# Patient Record
Sex: Male | Born: 1970 | Race: White | Hispanic: No | Marital: Married | Smoking: Never smoker
Health system: Southern US, Community
[De-identification: ages and names within clinical notes are randomized; demographics above are authoritative.]

## PROBLEM LIST (undated history)

## (undated) DIAGNOSIS — K5732 Diverticulitis of large intestine without perforation or abscess without bleeding: Secondary | ICD-10-CM

## (undated) DIAGNOSIS — R519 Headache, unspecified: Secondary | ICD-10-CM

## (undated) DIAGNOSIS — K529 Noninfective gastroenteritis and colitis, unspecified: Secondary | ICD-10-CM

## (undated) DIAGNOSIS — M109 Gout, unspecified: Secondary | ICD-10-CM

## (undated) DIAGNOSIS — I10 Essential (primary) hypertension: Secondary | ICD-10-CM

## (undated) DIAGNOSIS — F419 Anxiety disorder, unspecified: Secondary | ICD-10-CM

## (undated) DIAGNOSIS — R51 Headache: Secondary | ICD-10-CM

## (undated) HISTORY — DX: Headache, unspecified: R51.9

## (undated) HISTORY — DX: Diverticulitis of large intestine without perforation or abscess without bleeding: K57.32

## (undated) HISTORY — DX: Headache: R51

## (undated) HISTORY — DX: Anxiety disorder, unspecified: F41.9

## (undated) HISTORY — PX: EYE SURGERY: SHX253

## (undated) HISTORY — DX: Noninfective gastroenteritis and colitis, unspecified: K52.9

## (undated) HISTORY — DX: Gout, unspecified: M10.9

---

## 1999-04-08 ENCOUNTER — Encounter: Payer: Self-pay | Admitting: Nephrology

## 1999-04-08 ENCOUNTER — Ambulatory Visit (HOSPITAL_COMMUNITY): Admission: RE | Admit: 1999-04-08 | Discharge: 1999-04-08 | Payer: Self-pay | Admitting: Nephrology

## 2001-08-29 ENCOUNTER — Encounter: Payer: Self-pay | Admitting: Emergency Medicine

## 2001-08-29 ENCOUNTER — Emergency Department (HOSPITAL_COMMUNITY): Admission: EM | Admit: 2001-08-29 | Discharge: 2001-08-29 | Payer: Self-pay | Admitting: Emergency Medicine

## 2002-04-29 ENCOUNTER — Emergency Department (HOSPITAL_COMMUNITY): Admission: EM | Admit: 2002-04-29 | Discharge: 2002-04-29 | Payer: Self-pay | Admitting: Emergency Medicine

## 2002-04-29 ENCOUNTER — Encounter: Payer: Self-pay | Admitting: Emergency Medicine

## 2003-09-06 ENCOUNTER — Encounter: Admission: RE | Admit: 2003-09-06 | Discharge: 2003-09-06 | Payer: Self-pay | Admitting: Family Medicine

## 2003-09-18 ENCOUNTER — Ambulatory Visit (HOSPITAL_COMMUNITY): Admission: RE | Admit: 2003-09-18 | Discharge: 2003-09-18 | Payer: Self-pay | Admitting: Family Medicine

## 2003-09-18 ENCOUNTER — Encounter: Admission: RE | Admit: 2003-09-18 | Discharge: 2003-09-18 | Payer: Self-pay | Admitting: Family Medicine

## 2003-09-19 ENCOUNTER — Encounter: Admission: RE | Admit: 2003-09-19 | Discharge: 2003-09-19 | Payer: Self-pay | Admitting: Family Medicine

## 2005-03-18 ENCOUNTER — Emergency Department (HOSPITAL_COMMUNITY): Admission: EM | Admit: 2005-03-18 | Discharge: 2005-03-19 | Payer: Self-pay | Admitting: Emergency Medicine

## 2006-12-01 LAB — HM COLONOSCOPY

## 2007-06-02 ENCOUNTER — Emergency Department (HOSPITAL_COMMUNITY): Admission: EM | Admit: 2007-06-02 | Discharge: 2007-06-02 | Payer: Self-pay | Admitting: Emergency Medicine

## 2007-11-24 ENCOUNTER — Emergency Department (HOSPITAL_COMMUNITY): Admission: EM | Admit: 2007-11-24 | Discharge: 2007-11-24 | Payer: Self-pay | Admitting: Emergency Medicine

## 2007-12-14 ENCOUNTER — Ambulatory Visit (HOSPITAL_COMMUNITY): Admission: RE | Admit: 2007-12-14 | Discharge: 2007-12-14 | Payer: Self-pay | Admitting: Nephrology

## 2007-12-16 ENCOUNTER — Ambulatory Visit (HOSPITAL_COMMUNITY): Admission: RE | Admit: 2007-12-16 | Discharge: 2007-12-16 | Payer: Self-pay | Admitting: Nephrology

## 2008-02-03 ENCOUNTER — Ambulatory Visit (HOSPITAL_COMMUNITY): Admission: RE | Admit: 2008-02-03 | Discharge: 2008-02-03 | Payer: Self-pay | Admitting: Gastroenterology

## 2008-02-16 ENCOUNTER — Ambulatory Visit (HOSPITAL_COMMUNITY): Admission: RE | Admit: 2008-02-16 | Discharge: 2008-02-16 | Payer: Self-pay | Admitting: Gastroenterology

## 2008-02-17 ENCOUNTER — Emergency Department (HOSPITAL_COMMUNITY): Admission: EM | Admit: 2008-02-17 | Discharge: 2008-02-17 | Payer: Self-pay | Admitting: Emergency Medicine

## 2008-02-18 ENCOUNTER — Emergency Department (HOSPITAL_COMMUNITY): Admission: EM | Admit: 2008-02-18 | Discharge: 2008-02-18 | Payer: Self-pay | Admitting: Gastroenterology

## 2008-02-18 ENCOUNTER — Encounter: Admission: RE | Admit: 2008-02-18 | Discharge: 2008-02-18 | Payer: Self-pay | Admitting: Pediatrics

## 2008-02-21 ENCOUNTER — Encounter: Admission: RE | Admit: 2008-02-21 | Discharge: 2008-02-21 | Payer: Self-pay | Admitting: Gastroenterology

## 2008-02-21 ENCOUNTER — Inpatient Hospital Stay (HOSPITAL_COMMUNITY): Admission: AD | Admit: 2008-02-21 | Discharge: 2008-02-23 | Payer: Self-pay | Admitting: Gastroenterology

## 2008-03-01 ENCOUNTER — Ambulatory Visit (HOSPITAL_COMMUNITY): Admission: RE | Admit: 2008-03-01 | Discharge: 2008-03-01 | Payer: Self-pay | Admitting: Gastroenterology

## 2008-06-30 IMAGING — CT CT ABDOMEN W/ CM
2 of 6 series · 17 of 46 positions shown, 19 images · IV contrast (omnipaque)
Comparison: none.

CLINICAL DATA: Abdominal pain with nausea, diarrhea, and weight loss.
 ABDOMEN CT WITH CONTRAST:
TECHNIQUE: Multidetector CT imaging of the abdomen was performed following the standard protocol during bolus administration of intravenous contrast.
 Contrast:  100 cc Omnipaque 300 and oral contrast.
TECHNIQUE: Multidetector CT imaging of the pelvis was performed following the standard protocol during bolus administration of intravenous contrast.
 No focal masses, adenopathy, or fluid collections. Specifically no evidence for inflammation of the terminal ileum or any other large or small bowel loops. Osseous structures intact. Appendix is not definitely visualized.

[Series 2: abd_pel 5.0 b40s · axial · 0.81mm/px · z∈[-519,-104]mm · 14 of 95 slices shown, 16 images]
[im 6/95  soft-tissue]
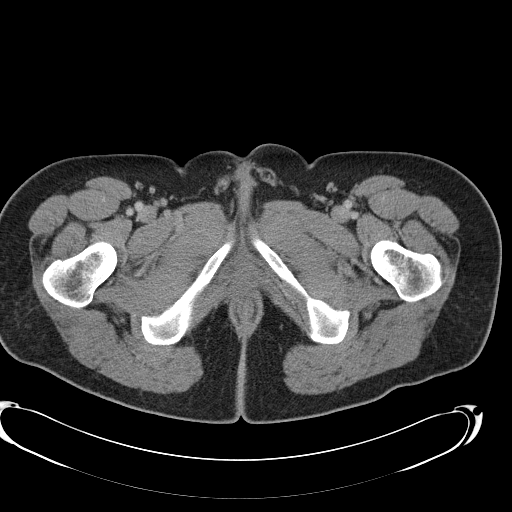
[im 6/95  bone]
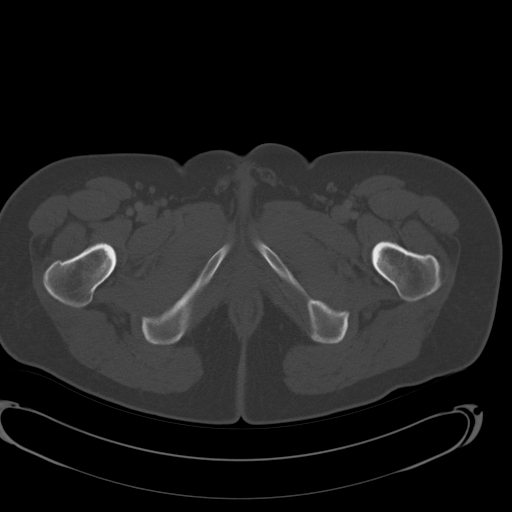
[im 12/95  soft-tissue]
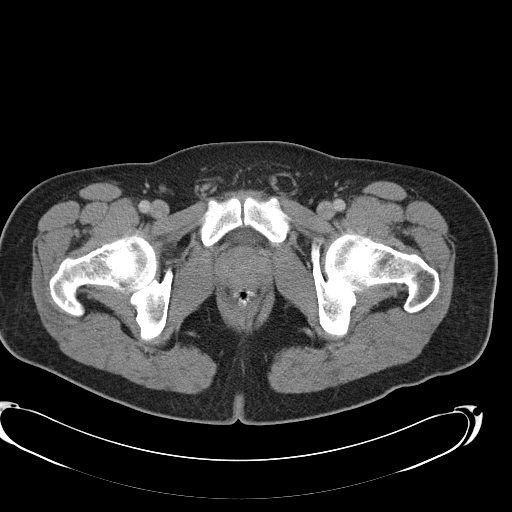
[im 17/95  soft-tissue]
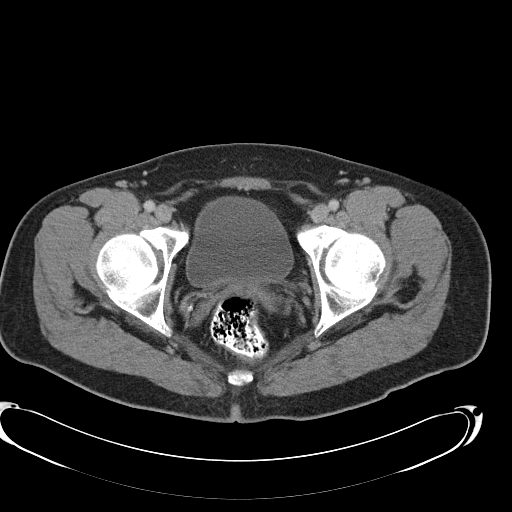
[im 28/95  soft-tissue]
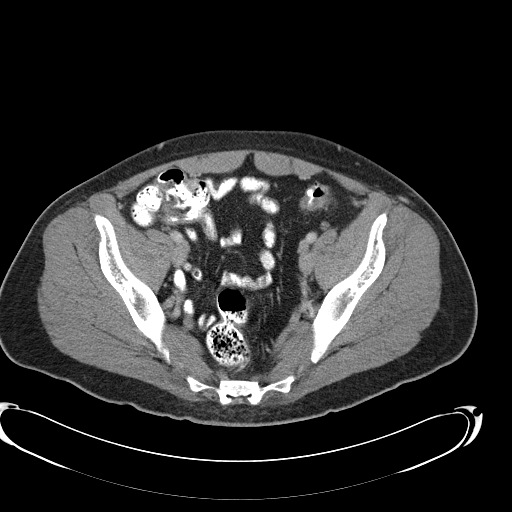
[im 34/95  soft-tissue]
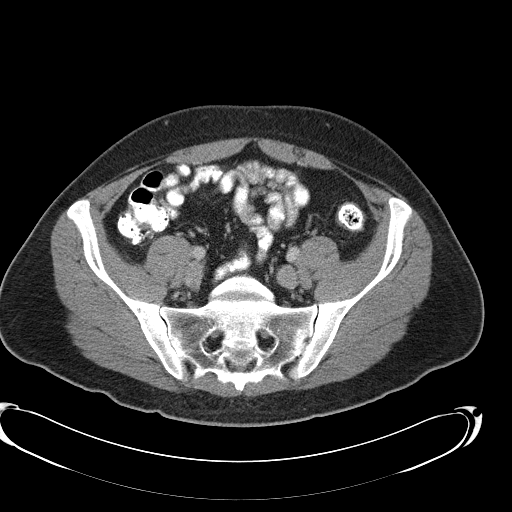
[im 39/95  soft-tissue]
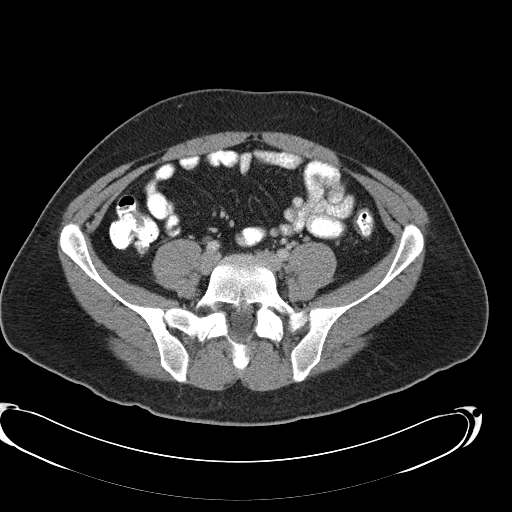
[im 45/95  soft-tissue]
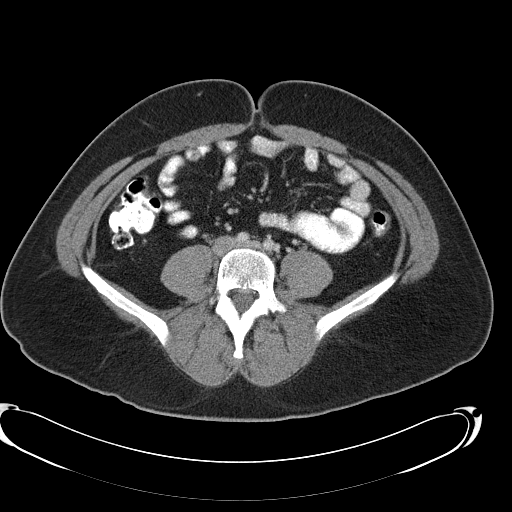
[im 50/95  soft-tissue]
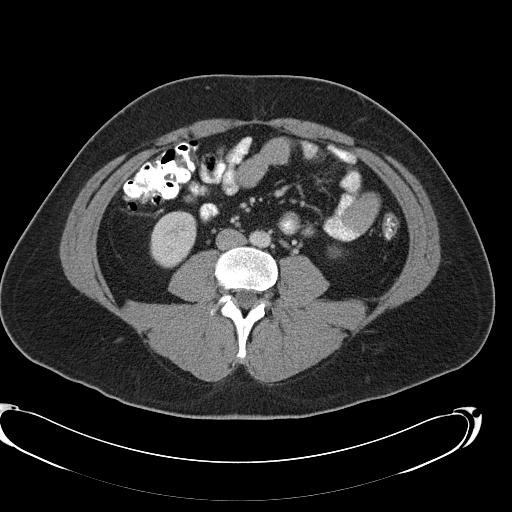
[im 56/95  soft-tissue]
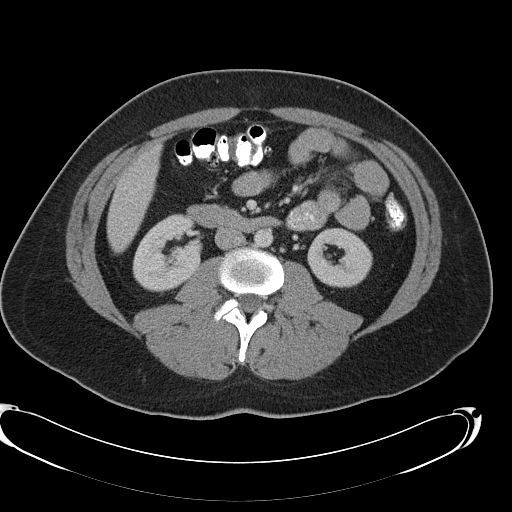
[im 56/95  bone]
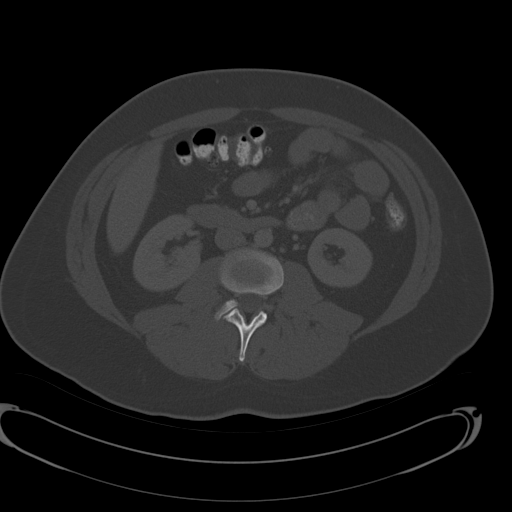
[im 61/95  soft-tissue]
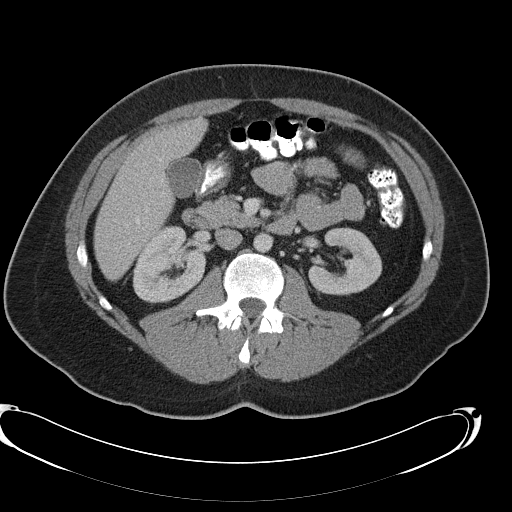
[im 72/95  soft-tissue]
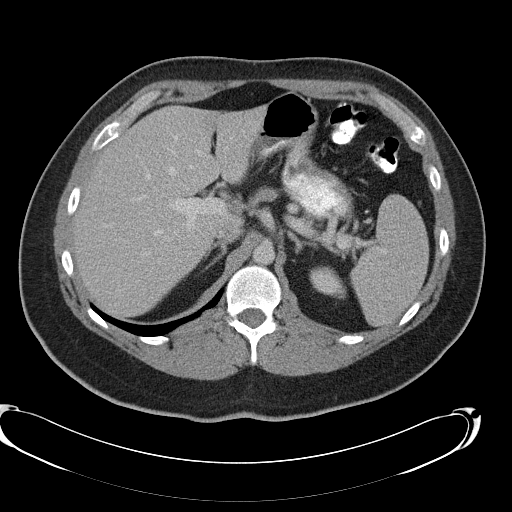
[im 78/95  soft-tissue]
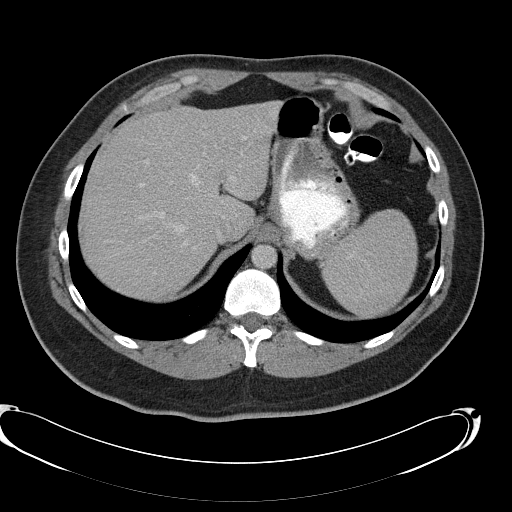
[im 83/95  soft-tissue]
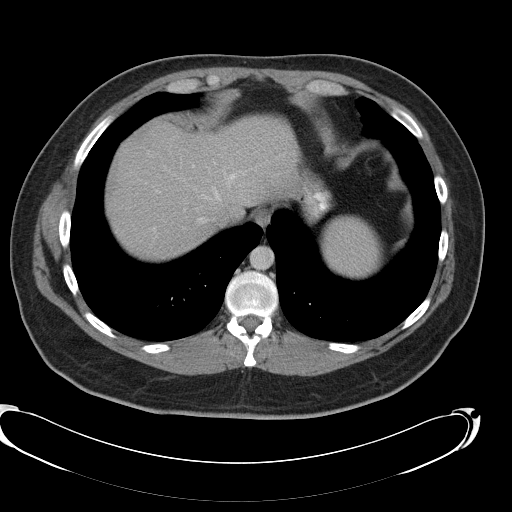
[im 89/95  soft-tissue]
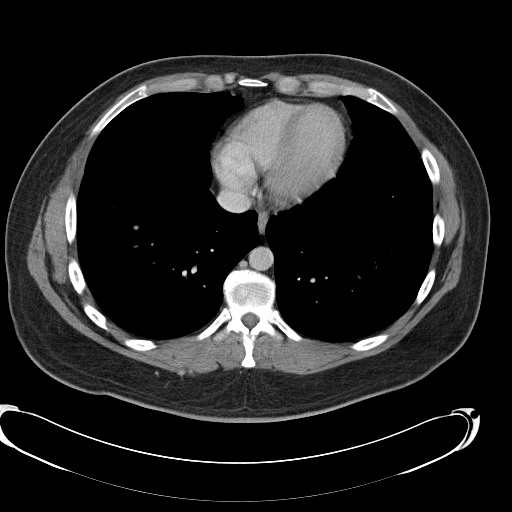

[Series 602: <mpr thick range> · coronal · 0.92mm/px · 3 of 95 slices shown]
[im 24/95  soft-tissue]
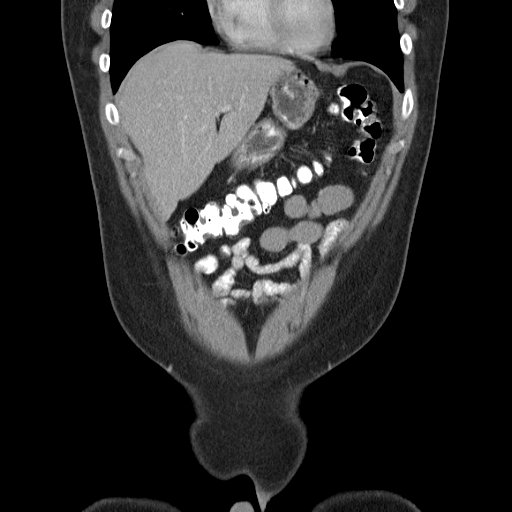
[im 48/95  soft-tissue]
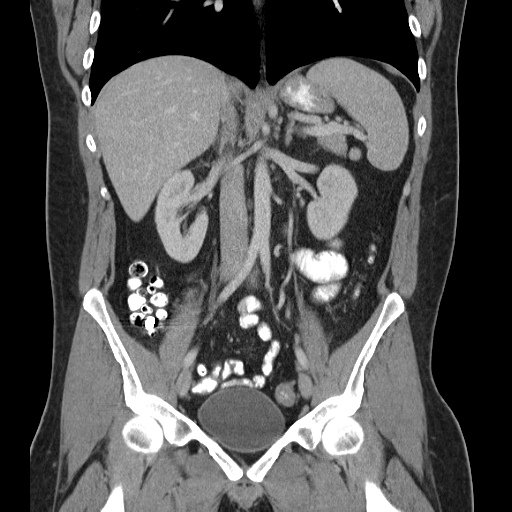
[im 71/95  soft-tissue]
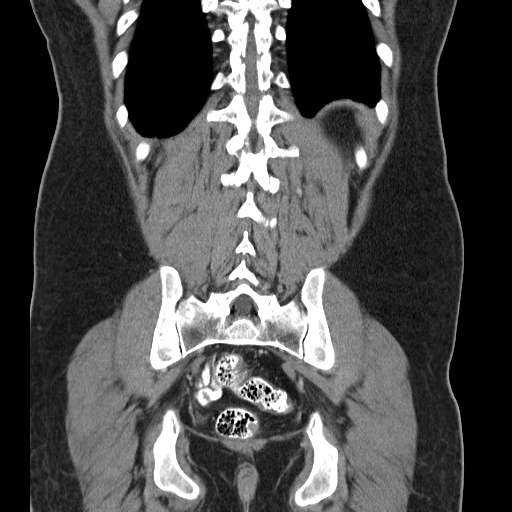

[17 of 46 positions shown; findings below may reference images not displayed]

FINDINGS: The lung bases are clear.  Liver, spleen, pancreas, and adrenals normal.  Early and delayed images of the kidneys normal. No adenopathy or ascites.
IMPRESSION: Normal except for focal segment of abnormality of the proximal small bowel in the left upper quadrant. This is felt to be an insignificant finding, probably ?transient intussusception?, but it does need clinical correlation as discussed above.
 PELVIS CT WITH CONTRAST:
IMPRESSION: No pathological findings in the pelvis.

## 2008-07-02 IMAGING — CR DG ABDOMEN 1V
1 series · 1 of 1 positions shown · non-contrast
Comparison: none

CLINICAL DATA: Abnormal CT.  
 ABDOMEN ONE VIEW:

[view not recorded]
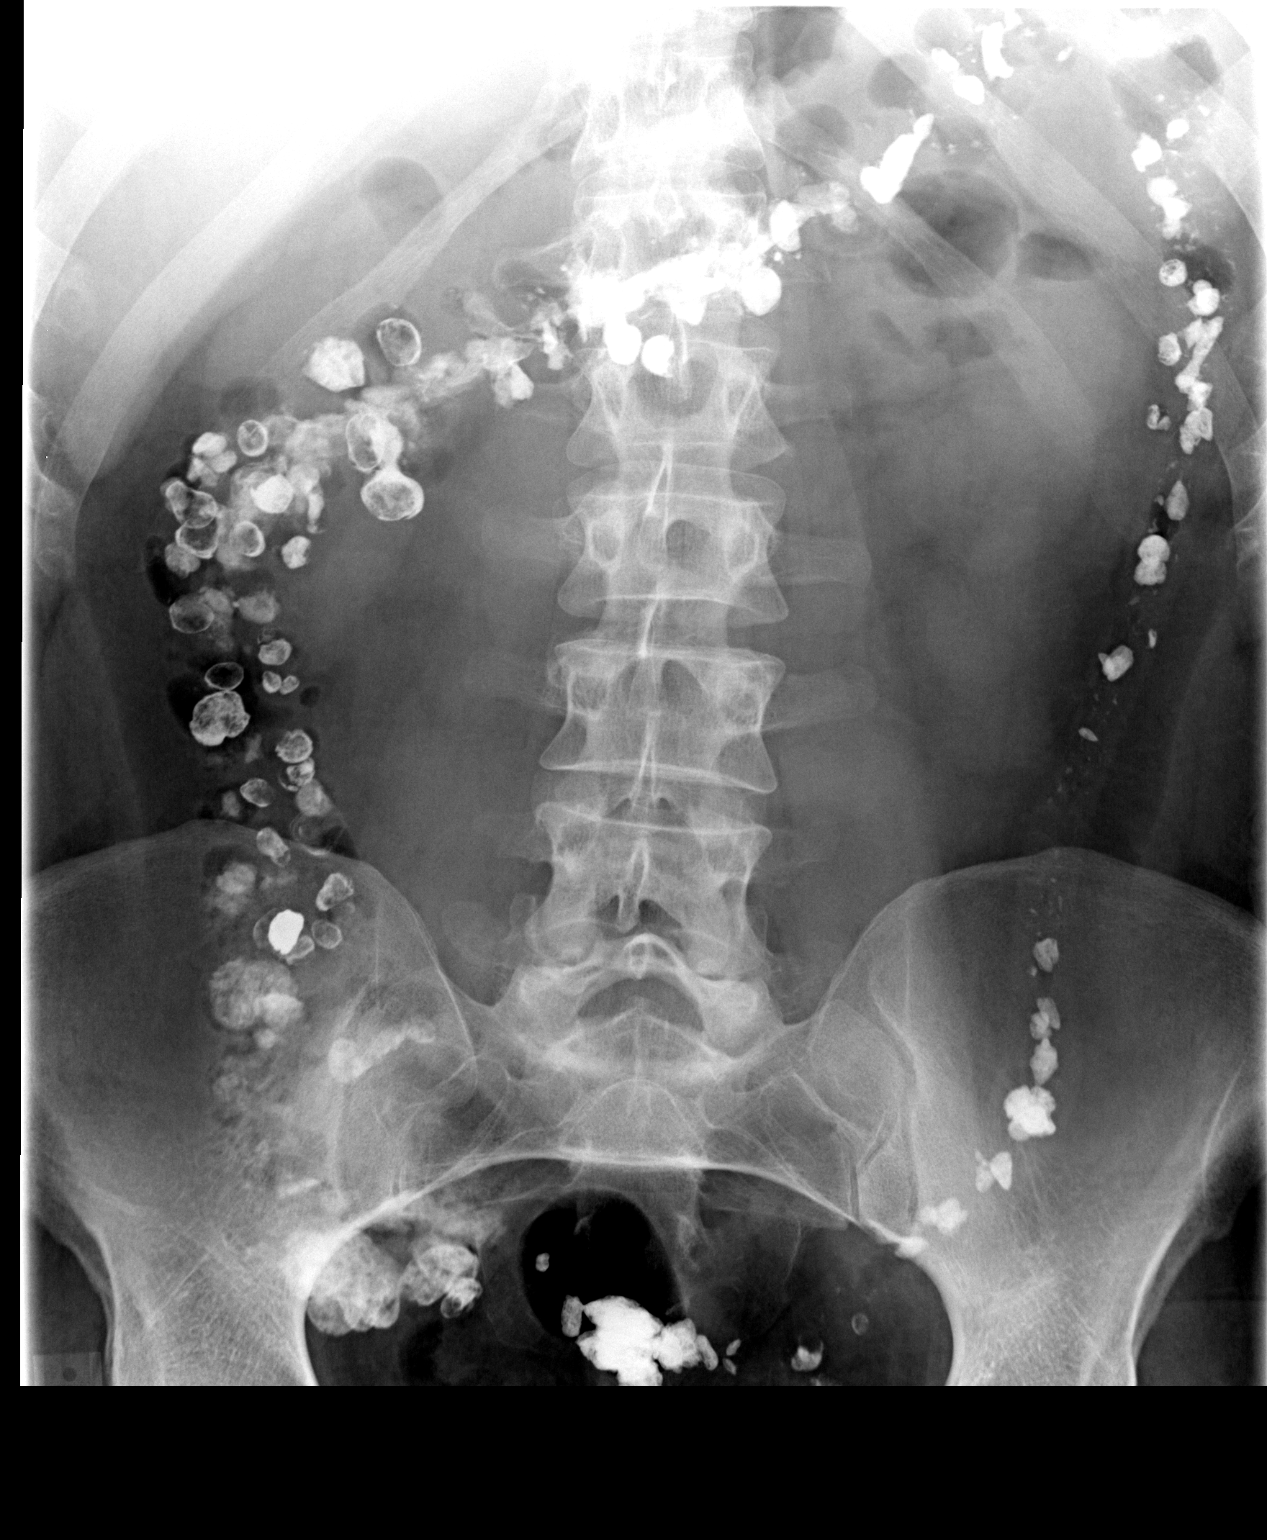

[1 of 1 positions shown; findings below may reference images not displayed]

FINDINGS: There is a moderate amount of contrast in the colon.  There is pan diverticulosis with many tics filled with contrast.  There is no bowel obstruction.  No acute bony abnormality.
IMPRESSION: Retained CT contrast in the colon.  The small bowel follow through is rescheduled for [REDACTED].

## 2008-08-11 ENCOUNTER — Encounter: Admission: RE | Admit: 2008-08-11 | Discharge: 2008-08-11 | Payer: Self-pay | Admitting: Gastroenterology

## 2011-04-15 NOTE — Discharge Summary (Signed)
NAME:  Darrell Lawson, Darrell NO.:  [id number]   MEDICAL RECORD NO.:  [id number]          PATIENT TYPE:  INP   LOCATION:  5123                         FACILITY:  [facility]   PHYSICIAN:  Graylin Shiver, M.D.   DATE OF BIRTH:  12-18-1970   DATE OF ADMISSION:  02/21/2008  DATE OF DISCHARGE:  02/23/2008                               DISCHARGE SUMMARY   Darrell Lawson was admitted to [facility] on February 21, 2008.  He  was discharged on February 23, 2008, at his own request.   ADMITTING DIAGNOSES:  1. Abdominal pain.  2. Nausea.  3. Intermittent vomiting.   DISCHARGE DIAGNOSES:  1. Anxiety.  2. Abdominal pain.  3. Nausea.  4. Intermittent vomiting.   CONSULTS:  None.   PROCEDURES:  1. Colonoscopy on February 22, 2008, to rule out inflammatory bowel      disease.  Results were significant for scattered diverticulosis.  2. CT enterography on February 23, 2008.  Results were mild nonspecific      prominence of duodenal and proximal jejunal wall.   HISTORY AND PHYSICAL:  This is a 40 year old gentleman who has been  experiencing left upper quadrant pain per his reports since he had a  gastrointestinal virus in July 2008.  It has become more severe this  winter.  In January 2009, GI workup prior to admission done by Dr.  Charlott Rakes includes:  1. Negative gastric emptying study.  2. Negative abdominal ultrasound.  3. Normal upper endoscopy with CLO biopsies that were negative.  4. CT scan that showed possible transient intussusception.  5. Small bowel followthrough that was negative.  6. The patient has been tried on Reglan, Carafate, and proton pump      inhibitors without relief.  7. The patient came to [facility] ER on February 18, 2008, with similar      symptoms.   He was guaiac negative at that time.  He did have a surgery consult Dr.  Bertram Savin who determined that he had no surgical issues.  Dr. Molly Maduro  Buccini released him on doxycycline for empiric treatment  of  diverticulitis.  The patient went home and over the weekend reports that  he was unable to take the doxycycline because it caused abdominal upset.   During this hospitalization:  1. He was admitted on February 19, 2008, given IV fluids, Cipro, and      Flagyl.  2. Underwent colonoscopy, which was essentially normal.  3. Had a negative CT enterography.  4. Serum tTg was tested and was negative for celiac disease.   LABORATORY DATA:  Labs during his hospitalization were normal.  CMET was  normal.  CBC normal.  Sedimentation rate was 2.  LDH was checked, it was  89.  Lipase was checked at 19.  Coags were normal.   PHYSICAL EXAMINATION:  GENERAL:  The patient presented alert and  oriented, but tearful.  HEART:  Regular rate and rhythm.  LUNGS:  Clear to auscultation throughout his hospitalization.  ABDOMEN:  Soft, nontender, and nondistended with positive bowel sounds.  He remained  guaiac negative.   The patient requested discharge the evening of February 23, 2008.  Of note,  he was often tearful and somewhat anxious, frequently asking questions  about pancreatic cancer, colostomy bags, and feeding tubes despite  continuous reassurance being given that at this point in his workup  these were not even being considered.  The patient was discharged to  home in stable condition.   DISCHARGE MEDICATIONS:  1. Protonix 40 mg daily.  2. Percocet 5/325, 1-2 tablets q.6 hours p.r.n.  3. Zofran 4 mg q.8 hours p.r.n. nausea.  4. The patient requested Phenergan.  We called in prescriptions to [facility] for refill of his Protonix and Phenergan 25 mg p.o. q.8      hours p.r.n. nausea.  5. Levsin/SL 0.125 q.6 hours p.r.n. pain.   FOLLOWUP:  The patient was given instructions to follow up with Dr.  Bosie Clos at [facility] within 2 weeks.      Stephani Police, PA    ______________________________  Graylin Shiver, M.D.    MLY/MEDQ  D:  02/25/2008  T:  02/26/2008  Job:   [id number]   cc:   Shirley Friar, MD

## 2011-04-15 NOTE — Op Note (Signed)
NAME:  JESSELEE, POTH NO.:  [id number]   MEDICAL RECORD NO.:  [id number]          PATIENT TYPE:  INP   LOCATION:  5123                         FACILITY:  [facility]   PHYSICIAN:  Graylin Shiver, M.D.   DATE OF BIRTH:  [birth year]   DATE OF PROCEDURE:  02/22/2008  DATE OF DISCHARGE:                               OPERATIVE REPORT   INDICATION:  Abdominal pain, etiology unclear.   Informed consent was obtained after explanation of the risks of  bleeding, infection, and perforation.   PREMEDICATION:  Fentanyl 100 mcg IV, Versed 10 mg IV.   PROCEDURE:  With the patient in the left lateral decubitus position, a  rectal exam was performed.  No masses were felt.  The Pentax colonoscope  was inserted into the rectum and advanced around the colon to the cecum.  Cecal landmarks were identified.  The ileocecal valve was intubated, and  the first few centimeters of terminal ileum were inspected and looked  normal.  The cecum looked normal.  There were a few diverticula noted in  the ascending colon.  There were a few diverticula noted in the  transverse, descending, and sigmoid colon.  The rectum looked normal.  Retroflexion was normal.  He tolerated the procedure well without  complications.   IMPRESSION:  Scattered diverticulosis.   COMMENTS:  The patient has diverticulosis, but I do not think that this  is causing his chronic intermittent left upper quadrant abdominal pain.  There is no evidence of diverticulitis.  I will take him off antibiotics  which he is currently on   The patient had a CT scan of the abdomen recently which raised the  suspicion of a jejunal intussusception.  A follow-up small bowel series  was negative.  It is possible that he could be having intermittent  episodes of small bowel intussusception.  We are waiting on a tissue  transglutaminase since celiac disease can be associated with small bowel  intussusception.  At the present time, I am going to  advance his diet  and observe him.  In the event that he has another severe attack of  abdominal pain associated with nausea and vomiting, I think we will need  to do a CT enteropathy during the episode to see if we can document  intussusception.           ______________________________  Graylin Shiver, M.D.     SFG/MEDQ  D:  02/22/2008  T:  02/22/2008  Job:  [id number]   cc:   Shirley Friar, MD

## 2011-04-15 NOTE — Consult Note (Signed)
NAME:  WORTH, KOBER NO.:  [id number]   MEDICAL RECORD NO.:  [id number]          PATIENT TYPE:  EMS   LOCATION:  MAJO                         FACILITY:  [facility]   PHYSICIAN:  Ashok Croon      DATE OF BIRTH:  11/30/1971   DATE OF CONSULTATION:  02/18/2008  DATE OF DISCHARGE:                                 CONSULTATION   REQUESTING PHYSICIAN:  Bernette Redbird, M.D.   CONSULTING SURGEON:  Dr. Freida Busman.   PRIMARY CARE PHYSICIAN:  Dr. Bascom Levels.   REASON FOR CONSULTATION:  Abdominal pain and recent CT findings of  transient intussusception.   HISTORY OF PRESENT ILLNESS:  Darrell Lawson is a 40 year old male patient  who has known gastroesophageal reflux disease.  He has had multiple ER  visits since December of 2008 for recurrent abdominal pain.  Has been  treated as gastritis and/or GERD symptoms.  He has recently been  evaluated by [facility], and underwent a CT on February 16, 2008 which  demonstrated transient intussusception in the mid small bowel.  He has  been back to the [facility] on February 17, 2008 and back today to  [place].  He was supposed to have a small-bowel follow-through done, but he  had retained CT contrast, and they were unable to complete.  He is back  to the ER because of continued abdominal discomfort and nausea.   REVIEW OF SYSTEMS:  CONSTITUTIONAL:  The patient denies any recent  fevers, chills or myalgias.  GI:  The patient has had nausea, diarrhea  and weight loss over the past few weeks on and off since December.  Past  week, he has had a 5 pound weight loss, and he has not eaten much of  anything.  The pain has again increased in the past week.  He has had no  blood in his stool.  No dark stools.  He has had a mild decrease in pain  in the past 24 hours, but he is still quite nauseated and having  anorexia symptoms.  He had a last bowel movement yesterday morning.  He  is tolerating liquids okay and was tolerating solids well until  Wednesday when these began to give him nausea and pain.  Currently his  pain is located in the left lower quadrant for the past 1-2 days.   FAMILY HISTORY:  Family medical history noncontributory.   SOCIAL HISTORY:  He is married.  No alcohol, no tobacco.   PAST MEDICAL HISTORY:  1. GERD.  2. Gastritis.   PAST SURGICAL HISTORY:  None.   ALLERGIES:  NO KNOWN DRUG ALLERGIES.   CURRENT MEDICATIONS:  Protonix.   PHYSICAL EXAMINATION:  GENERAL:  Pleasant male patient complaining of  anorexia and left lower quadrant abdominal pain.  VITAL SIGNS:  Temperature 97.9, BP 120/76, pulse rate 76 and regular,  respirations 18.  HEENT:  Sclerae noninjected.  Conjunctivae pink.  Pupils are equal,  round, and reactive to light.  Ears are symmetrical in appearance, no  lesions.  Nose is symmetrical.  No drainage.  Mouth is with mucous  membranes pink and moist.  Throat is unremarkable.  No exudate.  No  erythema.  NECK:  Trachea is midline.  Thyroid is nonpalpable.  Neck is without  masses.  LUNGS:  Respiratory effort is nonlabored.  Rate is not tachypneic.  Bilateral lung sounds are clear to auscultation.  CARDIOVASCULAR:  Heart sounds are S1 and S2.  No rubs, murmurs, thrills.  No gallops, no JVD.  No peripheral edema.  Pulses regular.  Pulses,  carotid, radial and pedal are 2+ bilaterally.  BREASTS:  Exam is deferred.  ABDOMEN:  Soft.  Bowel sounds are present.  He is tender in the left  lower quadrant without guarding or rebounding.  No hernias.  No masses  noted.  LYMPHATICS:  Exam was deferred.  MUSCULOSKELETAL:  Extremities are symmetrical in appearance.  No  clubbing, no cyanosis.  SKIN:  No evidence of significant rashes, lesions, masses or moles.  NEUROLOGIC:  Cranial nerves II-XII are grossly intact.  Upper and lower  extremities grossly intact.  The patient is ambulatory without gait  disturbance.  PSYCHIATRIC:  The patient is oriented to person, place, time and  situation.   His affect as appropriate to situation.   LABORATORY DATA:  Labs from February 17, 2008:  White count 6900,  hemoglobin 14.8, platelets 310,000.  Sodium 136, potassium 4.4, CO2 29,  BUN 10, creatinine 1.0, glucose 95.   Diagnostic CT of the abdomen and pelvis from February 16, 2008 shows a mid  small bowel focal thickening.  Radiologist feels this is more consistent  with transient intussusception, possibly Crohn's, but this is the only  area this abnormality is seen in, and they feel this is probably not a  supportable diagnosis based on the CT.  Plain abdominal x-rays today  done scout films to see if the patient could undergo small bowel follow-  through again shows retained contrast and multiple pancolonic  diverticula.   IMPRESSION:  1. Recurrent nausea and upper abdominal pain with recent CT findings      suggestive of transient intussusception versus possible focal      Crohn's.  2. Questionable early diverticulitis in a patient with left lower      quadrant pain and diverticulum seen on x-ray.   PLAN:  1. No acute surgical issues at this time.  The patient does not have      an acute surgical abdomen, no white count, and his pain is more in      the left lower quadrant.  No free air on plain x-ray.  2. Recommend if patient can tolerate liquids and you otherwise feel he      is appropriate to discharge home, then followup with small-bowel      follow-through.  This is an optional plan.  3. Would go ahead and treat any potential prevention of constipation      with stool softeners and MiraLax and consider antibiotic therapy or      other treatment for early diverticulitis given his left lower      quadrant pain and findings of diverticulum, pancolonic, on x-ray.      Allison L. Rennis Harding, N.P.    ______________________________  Ashok Croon    ALE/MEDQ  D:  02/18/2008  T:  02/18/2008  Job:  [id number]   cc:   Bernette Redbird, M.D.

## 2011-04-15 NOTE — H&P (Signed)
NAME:  Darrell, Lawson NO.:  [id number]   MEDICAL RECORD NO.:  [id number]          PATIENT TYPE:  INP   LOCATION:  5123                         FACILITY:  [facility]   PHYSICIAN:  Shirley Friar, MDDATE OF BIRTH:  [birth year]   DATE OF ADMISSION:  02/21/2008  DATE OF DISCHARGE:                              HISTORY & PHYSICAL   ADMISSION DIAGNOSES:  1. Abdominal pain.  2. Nausea.  3. Intermittent vomiting.   HISTORY OF PRESENT ILLNESS:  Darrell Lawson is a 40 year old white male who  has had chronic intermittent left upper quadrant abdominal pain since  February.  Workup thus far has not shown the source of this pain.  He  had a negative upper endoscopy, negative gastric emptying study done.  Upper endoscopy was done which was normal.  CLO-test was negative as  well.  He has failed to respond to trials of Reglan and Carafate and  various proton pump inhibitors.  He has been in the emergency room  several times over the last week with this severe abdominal pain and  nausea and vomiting.  CAT scan was done which showed a focal segment of  abnormality in his proximal small bowel the left upper quadrant thought  possibly to be due to transient intussusception.  No other abnormalities  were seen.  During his emergency room visit on March 20, because of  severe pain, he was seen by my partner Dr. Matthias Hughs who gave him  doxycycline for possible diverticulitis.  Darrell Lawson has been unable to  take the doxycycline due to severe vomiting and nausea.  He also was  seen by Dr. Bertram Savin, who reportedly felt there was no acute surgical  issues.   Darrell Lawson had a small bowel series done today which was read as  negative with normal terminal ileum as well on preliminary finding on  preliminary report.  He came into the office following his small bowel  series saying that the Zofran that he was given for nausea and Percocet  for pain was not helping his symptoms.  He states that  his pain levels  is a 6 at this time, and he was unable to keep much liquids down.  He  has been forcing himself to eat, and his wife feels like he has lost 5  pounds since Friday, March 20.  His pain was 8/10 up until February 17, 2008.   PAST MEDICAL HISTORY:  1. History of anxiety.  2. History of reflux.   CURRENT MEDICATIONS:  1. Protonix 20 mg daily.  2. Percocet p.r.n.  3 . Zofran p.r.n.   ALLERGIES:  NO KNOWN DRUG ALLERGIES.   FAMILY HISTORY:  His father has history of colon polyps, denies family  history of colon cancer.   SOCIAL HISTORY:  Denies alcohol cigarettes.   REVIEW OF SYSTEMS:  Negative except as stated above.   PHYSICAL EXAMINATION:  VITAL SIGNS:  Temperature is 97.3, pulse 60,  blood pressure 110/80, weight 214 pounds.  GENERAL:  Tearful, mild to moderate acute distress.  ABDOMEN:  Tender in left mid quadrant, left  upper quadrant with minimal  guarding.  Otherwise, nontender, soft, nondistended, positive bowel  sounds.   IMPRESSION:  A 40 year old white male with persistent left upper  quadrant abdominal pain, nausea, vomiting, weight loss of unclear  etiology.  Differential diagnosis includes Crohn's enteritis versus  diverticulitis versus dysmotility.  Currently, I do not have enough  objective evidence to support Crohn's enteritis, and therefore, I am not  going to use any empiric steroids at this time.  I need to do further  evaluation of his colon and small bowel, and due to his severe pain,  nausea and intermittent vomiting, we will need to admit him to the  hospital to do inpatient workup.   PLAN:  Give IV antibiotics for possible diverticulitis.  Will also give  him IV antiemetics as needed and IV pain medicines as needed.  Whether  anxiety or other psychiatric issues are playing a roll with this pain is  possible, and he may need to have inpatient psychiatric consult while he  is in the hospital.  First goal will be to do a colonoscopy.  Once  the  contrast in the small bowel clears, and hopefully will be able to do  that colonoscopy on February 23, 2008.  If his colonoscopy is unremarkable  and he does not respond to antibiotics, may need to do an inpatient  capsule endoscopy.  Other possibilities include motility studies if  above workup is negative.  No indication for surgical intervention at  this time, but exploratory laparoscopy is also possibility depending on  above workup.  Will also check sedimentation rate as well as baseline  labs today.  Will also get view of his abdomen today.      Shirley Friar, MD  Electronically Signed     VCS/MEDQ  D:  02/21/2008  T:  02/21/2008  Job:  [id number]   cc:   Jarome Matin, M.D.  Lennie Muckle, MD

## 2011-08-25 LAB — COMPREHENSIVE METABOLIC PANEL
ALT: 13
ALT: 16
AST: 14
AST: 15
Albumin: 3.8
CO2: 28
Calcium: 9.5
Calcium: 9.7
Creatinine, Ser: 1.1
Creatinine, Ser: 1.1
GFR calc non Af Amer: >60
Sodium: 140
Sodium: 143
Total Protein: 7.2

## 2011-08-25 LAB — I-STAT 8, (EC8 V) (CONVERTED LAB)
Acid-Base Excess: 2
Bicarbonate: 27.6 — ABNORMAL HIGH
Chloride: 104
HCT: 45
Operator id: 295021
TCO2: 29
pCO2, Ven: 44.9 — ABNORMAL LOW
pH, Ven: 7.397 — ABNORMAL HIGH

## 2011-08-25 LAB — URINALYSIS, ROUTINE W REFLEX MICROSCOPIC
Bilirubin Urine: NEGATIVE
Nitrite: NEGATIVE
Specific Gravity, Urine: 1.01
Urobilinogen, UA: 0.2
pH: 8

## 2011-08-25 LAB — CBC
HCT: 38 — ABNORMAL LOW
HCT: 42.3
Hemoglobin: 14.8
MCHC: 34.3
MCV: 91.6
MCV: 92.7
Platelets: 268
Platelets: 337
RBC: 4.65
RDW: 12.4
RDW: 12.4
RDW: 12.6
WBC: 5.5

## 2011-08-25 LAB — DIFFERENTIAL
Basophils Absolute: 0
Lymphocytes Relative: 18
Monocytes Absolute: 0.5
Neutro Abs: 5.1
Neutrophils Relative %: 74

## 2011-08-25 LAB — POCT I-STAT CREATININE
Creatinine, Ser: 1
Operator id: 295021

## 2011-08-25 LAB — PROTIME-INR
INR: 1
Prothrombin Time: 13

## 2011-08-25 LAB — LIPASE, BLOOD: Lipase: 19

## 2011-08-25 LAB — TISSUE TRANSGLUTAMINASE, IGA

## 2011-09-05 LAB — DIFFERENTIAL
Monocytes Absolute: 0.5
Monocytes Relative: 9
Neutro Abs: 3.1

## 2011-09-05 LAB — COMPREHENSIVE METABOLIC PANEL
ALT: 17
AST: 19
Albumin: 4.5
Alkaline Phosphatase: 50
BUN: 7
Chloride: 103
GFR calc Af Amer: >60
Potassium: 4.2
Sodium: 137
Total Bilirubin: 0.8
Total Protein: 7.3

## 2011-09-05 LAB — CBC
HCT: 43.8
Platelets: 363
RDW: 12.4
WBC: 5.4

## 2011-09-05 LAB — LIPASE, BLOOD: Lipase: 30

## 2011-09-05 LAB — URINALYSIS, ROUTINE W REFLEX MICROSCOPIC
Ketones, ur: NEGATIVE
Nitrite: NEGATIVE
Specific Gravity, Urine: 1.011
pH: 7.5

## 2011-09-16 LAB — URINALYSIS, ROUTINE W REFLEX MICROSCOPIC
Nitrite: NEGATIVE
Protein, ur: 30 — AB
Specific Gravity, Urine: 1.033 — ABNORMAL HIGH
Urobilinogen, UA: 1

## 2011-09-16 LAB — DIFFERENTIAL
Basophils Absolute: 0
Lymphocytes Relative: 22
Lymphs Abs: 1.1
Neutro Abs: 3.4
Neutrophils Relative %: 67

## 2011-09-16 LAB — CBC
HCT: 40.9
MCHC: 34.5
MCV: 91
RBC: 4.5

## 2011-09-16 LAB — LIPASE, BLOOD: Lipase: 18

## 2011-09-16 LAB — COMPREHENSIVE METABOLIC PANEL
BUN: 13
CO2: 28
Calcium: 8.8
Creatinine, Ser: 0.93
GFR calc Af Amer: >60
GFR calc non Af Amer: >60
Glucose, Bld: 96

## 2011-09-16 LAB — URINE MICROSCOPIC-ADD ON

## 2012-08-28 ENCOUNTER — Emergency Department: Payer: Self-pay | Admitting: Emergency Medicine

## 2012-08-28 LAB — COMPREHENSIVE METABOLIC PANEL
Anion Gap: 8 (ref 7–16)
Bilirubin,Total: 0.9 mg/dL (ref 0.2–1.0)
Calcium, Total: 9.6 mg/dL (ref 8.5–10.1)
Chloride: 100 mmol/L (ref 98–107)
Co2: 28 mmol/L (ref 21–32)
Creatinine: 1.13 mg/dL (ref 0.60–1.30)
EGFR (African American): >60
EGFR (Non-African Amer.): >60
Osmolality: 272 (ref 275–301)
Potassium: 3.9 mmol/L (ref 3.5–5.1)
Sodium: 136 mmol/L (ref 136–145)

## 2012-08-28 LAB — URINALYSIS, COMPLETE
Bilirubin,UR: NEGATIVE
Hyaline Cast: 4
Ketone: NEGATIVE
Nitrite: NEGATIVE
Protein: NEGATIVE
RBC,UR: 1 /HPF (ref 0–5)
WBC UR: 1 /HPF (ref 0–5)

## 2012-08-28 LAB — CBC
MCH: 30.9 pg (ref 26.0–34.0)
MCHC: 34.1 g/dL (ref 32.0–36.0)
Platelet: 323 10*3/uL (ref 150–440)

## 2013-07-27 ENCOUNTER — Ambulatory Visit
Admission: RE | Admit: 2013-07-27 | Discharge: 2013-07-27 | Disposition: A | Discharge status: Home / Self Care | Payer: BC Managed Care – PPO | Source: Ambulatory Visit | Attending: Family Medicine | Admitting: Family Medicine

## 2013-07-27 ENCOUNTER — Other Ambulatory Visit: Payer: Self-pay | Admitting: Family Medicine

## 2013-07-27 DIAGNOSIS — R1032 Left lower quadrant pain: Secondary | ICD-10-CM

## 2013-07-27 DIAGNOSIS — K5792 Diverticulitis of intestine, part unspecified, without perforation or abscess without bleeding: Secondary | ICD-10-CM

## 2013-07-27 MED ORDER — IOHEXOL 300 MG/ML  SOLN
125.0000 mL | Freq: Once | INTRAMUSCULAR | Status: AC | PRN
  Administered 2013-07-27: 125 mL via INTRAVENOUS

## 2013-09-29 ENCOUNTER — Encounter (INDEPENDENT_AMBULATORY_CARE_PROVIDER_SITE_OTHER): Payer: Self-pay | Admitting: Surgery

## 2013-10-10 ENCOUNTER — Ambulatory Visit (INDEPENDENT_AMBULATORY_CARE_PROVIDER_SITE_OTHER): Payer: BC Managed Care – PPO | Admitting: Surgery

## 2013-10-12 ENCOUNTER — Encounter (INDEPENDENT_AMBULATORY_CARE_PROVIDER_SITE_OTHER): Payer: Self-pay | Admitting: General Surgery

## 2013-10-12 ENCOUNTER — Telehealth (INDEPENDENT_AMBULATORY_CARE_PROVIDER_SITE_OTHER): Payer: Self-pay | Admitting: *Deleted

## 2013-10-12 ENCOUNTER — Ambulatory Visit (INDEPENDENT_AMBULATORY_CARE_PROVIDER_SITE_OTHER): Payer: BC Managed Care – PPO | Admitting: General Surgery

## 2013-10-12 ENCOUNTER — Other Ambulatory Visit (INDEPENDENT_AMBULATORY_CARE_PROVIDER_SITE_OTHER): Payer: Self-pay | Admitting: General Surgery

## 2013-10-12 VITALS — BP 112/68 | HR 56 | Temp 97.1°F | Resp 16 | Ht 73.0 in | Wt 205.2 lb

## 2013-10-12 DIAGNOSIS — K5732 Diverticulitis of large intestine without perforation or abscess without bleeding: Secondary | ICD-10-CM | POA: Insufficient documentation

## 2013-10-12 HISTORY — DX: Diverticulitis of large intestine without perforation or abscess without bleeding: K57.32

## 2013-10-12 NOTE — Telephone Encounter (Signed)
Pt called and is requesting a different GI doctor.  He is requesting to see someone as [facility].  I will schedule an appt for the pt and call him back with that information.

## 2013-10-12 NOTE — Patient Instructions (Addendum)
Eat a high fiber diet. Stay well hydrated.  We will refer you for a colonoscopy.

## 2013-10-12 NOTE — Progress Notes (Signed)
Patient ID: Darrell Lawson, male   DOB: 1971-10-18, 42 y.o.   MRN: [id number]  Chief Complaint  Patient presents with  . New Evaluation    eval simoid diverticulitis    HPI Darrell Lawson is a 42 y.o. male.   HPI  He is referred by Dr. Duanne Guess for further evaluation and possible treatment of sigmoid diverticulitis. He had his first episode in January which was left lower quadrant pain. He was seen in the hospital and sent home on antibiotics. He's had 2 other episodes since then. A CT scan in August demonstrated some diverticular disease and thickening of the sigmoid and lower descending colon but no active signs of infection. He had a colonoscopy 4 years ago but none recently. He denies any blood in his stool. He has had some weight loss.  Past Medical History  Diagnosis Date  . Anxiety   . Gout   . Ulcer   . Diverticulitis large intestine     History reviewed. No pertinent past surgical history.  Family History  Problem Relation Age of Onset  . Cancer Maternal Grandmother     breast    Social History History  Substance Use Topics  . Smoking status: Never Smoker   . Smokeless tobacco: Never Used  . Alcohol Use: No    No Known Allergies  Current Outpatient Prescriptions  Medication Sig Dispense Refill  . ciprofloxacin (CIPRO) 500 MG tablet Take 500 mg by mouth 2 (two) times daily.      Marland Kitchen escitalopram (LEXAPRO) 10 MG tablet Take 10 mg by mouth daily.      . metroNIDAZOLE (FLAGYL) 500 MG tablet Take 500 mg by mouth 3 (three) times daily.       No current facility-administered medications for this visit.    Review of Systems Review of Systems  Constitutional: Negative for fever and chills.  HENT: Negative.   Respiratory: Negative.   Cardiovascular: Negative.   Gastrointestinal: Positive for nausea and abdominal pain. Negative for diarrhea.  Genitourinary: Positive for difficulty urinating.  Neurological: Negative.   Hematological: Negative.     Blood pressure  112/68, pulse 56, temperature 97.1 F (36.2 C), temperature source Temporal, resp. rate 16, height 6\' 1"  (1.854 m), weight 205 lb 3.2 oz (93.078 kg).  Physical Exam Physical Exam  Constitutional: He appears well-developed and well-nourished. No distress.  HENT:  Head: Normocephalic and atraumatic.  Eyes: No scleral icterus.  Neck: Neck supple.  Cardiovascular: Normal rate and regular rhythm.   Pulmonary/Chest: Effort normal and breath sounds normal.  Abdominal: Soft. He exhibits no distension and no mass. There is no tenderness.  Musculoskeletal: He exhibits no edema.  Lymphadenopathy:    He has no cervical adenopathy.  Neurological: He is alert.  Skin: Skin is warm and dry.  Psychiatric: He has a normal mood and affect. His behavior is normal.    Data Reviewed CT scan.  Assessment    Current episodes of what are felt to be diverticulitis. CT scan demonstrates a area of colonic thickening which could be colitis. Also need to rule out a neoplasm.     Plan    Referral to Dr. Bosie Clos for a colonoscopy. High fiber diet and stay well hydrated. If this is confirmed to be diverticulitis and he has a fourth episode, I would like to see him back and discuss elective surgery with him.        Darrell Lawson 10/12/2013, 10:02 AM

## 2013-10-12 NOTE — Telephone Encounter (Signed)
LMOM for pt to return my call.  I was calling pt to inform him that I attempted to call [facility] to schedule him an appt with Dr. Bosie Clos; however, his account is blocked until he speaks with the billing department.  Their phone number is [phone].  After his account is un-blocked we can schedule him an appt.

## 2013-10-13 ENCOUNTER — Other Ambulatory Visit (INDEPENDENT_AMBULATORY_CARE_PROVIDER_SITE_OTHER): Payer: Self-pay | Admitting: General Surgery

## 2013-10-13 DIAGNOSIS — K5732 Diverticulitis of large intestine without perforation or abscess without bleeding: Secondary | ICD-10-CM

## 2013-10-17 ENCOUNTER — Telehealth (INDEPENDENT_AMBULATORY_CARE_PROVIDER_SITE_OTHER): Payer: Self-pay | Admitting: *Deleted

## 2013-10-17 NOTE — Telephone Encounter (Signed)
I spoke with pt to follow up and see if he has received his records from [place] so an appt can be made at [facility].  He states he will go by there today and will call me after it is taken care of so I can make him an appt.

## 2013-10-24 ENCOUNTER — Encounter: Payer: Self-pay | Admitting: Gastroenterology

## 2013-10-24 NOTE — Telephone Encounter (Signed)
I spoke with pts sister Trula Ore) and informed her of appt information with Dr. Arlyce Dice at [facility].  Pre op appt is on 12/02/13 with an arrival time of 10:30am.  Colonoscopy is scheduled for 12/16/13 with an arrival time of 3:00pm.  Phone number was provided for [facility].

## 2013-12-16 ENCOUNTER — Encounter: Payer: BC Managed Care – PPO | Admitting: Gastroenterology

## 2014-02-01 ENCOUNTER — Encounter: Payer: BC Managed Care – PPO | Admitting: Gastroenterology

## 2014-03-22 ENCOUNTER — Encounter: Payer: BC Managed Care – PPO | Admitting: Gastroenterology

## 2015-09-12 ENCOUNTER — Other Ambulatory Visit: Payer: Self-pay | Admitting: Family Medicine

## 2015-09-12 DIAGNOSIS — R1011 Right upper quadrant pain: Secondary | ICD-10-CM

## 2015-09-19 ENCOUNTER — Other Ambulatory Visit: Payer: Self-pay

## 2015-10-23 ENCOUNTER — Other Ambulatory Visit: Payer: Self-pay | Admitting: Otolaryngology

## 2015-10-23 DIAGNOSIS — J0110 Acute frontal sinusitis, unspecified: Secondary | ICD-10-CM

## 2015-10-30 ENCOUNTER — Other Ambulatory Visit: Payer: Self-pay

## 2016-01-23 ENCOUNTER — Ambulatory Visit (INDEPENDENT_AMBULATORY_CARE_PROVIDER_SITE_OTHER): Payer: Managed Care, Other (non HMO) | Admitting: Primary Care

## 2016-01-23 ENCOUNTER — Encounter: Payer: Self-pay | Admitting: Primary Care

## 2016-01-23 VITALS — BP 120/70 | HR 75 | Temp 98.0°F | Ht 73.0 in | Wt 205.4 lb

## 2016-01-23 DIAGNOSIS — J320 Chronic maxillary sinusitis: Secondary | ICD-10-CM | POA: Insufficient documentation

## 2016-01-23 DIAGNOSIS — J3489 Other specified disorders of nose and nasal sinuses: Secondary | ICD-10-CM

## 2016-01-23 MED ORDER — PREDNISONE 10 MG PO TABS: ORAL_TABLET | ORAL | Status: DC

## 2016-01-23 NOTE — Assessment & Plan Note (Signed)
Ongoing since October 2016. Evaluated by ENT who wanted CT max/facial. Re-ordered CT scan as he's failed permanent improvement with prednisone and antibiotics. Exam concerning. Will have him complete CT scan and follow up with ENT. RX for prednisone provided today.

## 2016-01-23 NOTE — Patient Instructions (Addendum)
Nasal Congestion: Try using Flonase (fluticasone) nasal spray. Instill 2 sprays in each nostril once daily.   Start prednisone. Take 3 tables for 3 days, then 2 tablets for 3 days, then 1 tablet for 3 days.  Stop by the front desk and speak with either Shirlee Limerick or Revonda Standard regarding your CT scan. They will try to get this approved.  Please contact ENT and notify them once the CT scan is complete. I will call you once I receive the results as well.  It was a pleasure to meet you today! Please don't hesitate to call me with any questions. Welcome to [facility]!

## 2016-01-23 NOTE — Progress Notes (Signed)
   Subjective:    Patient ID: Darrell Lawson, male    DOB: 08-12-1971, 45 y.o.   MRN: [id number]  HPI  Darrell Lawson is a 45 year old male who presents today with a chief complaint of sinus pressure. His symptoms originated in October 2016 with pressure and difficulty breathing to his left nostril. He took antihistamines and sudafed for several weeks without improvement. He was then prescribed Singulair and saw no improvement over several weeks.   He was evaluated by ENT in early December 2016 who reqested sinus CT scan. His insurance company denied the request for the CT scan as they wanted him to try antibiotics. He was on Augmentin, prednisone, and sudafed in December 2016 by ENT which improved some symptoms temporarily. Once he completed the prednisone and antibiotic regimen, his symptoms returned. He's continued to feel pressure and congestion since.   His symptoms currently consist of sinus pressure, nasal congestion, headaches, left ear pain. He's not blowing anything from his nose. Denies fevers, feeling ill, cough. He's taken tylenol and Floase without improvement.  Review of Systems  Constitutional: Negative for fever, chills and fatigue.  HENT: Positive for congestion and sinus pressure.   Respiratory: Negative for cough and shortness of breath.   Cardiovascular: Negative for chest pain.  Neurological: Positive for headaches.       Past Medical History  Diagnosis Date  . Anxiety   . Gout   . Ulcer   . Diverticulitis large intestine     Social History   Social History  . Marital Status: Married    Spouse Name: N/A  . Number of Children: N/A  . Years of Education: N/A   Occupational History  . Not on file.   Social History Main Topics  . Smoking status: Never Smoker   . Smokeless tobacco: Never Used  . Alcohol Use: No  . Drug Use: No  . Sexual Activity: Not on file   Other Topics Concern  . Not on file   Social History Narrative    No past surgical history on  file.  Family History  Problem Relation Age of Onset  . Cancer Maternal Grandmother     breast    No Known Allergies  Current Outpatient Prescriptions on File Prior to Visit  Medication Sig Dispense Refill  . escitalopram (LEXAPRO) 10 MG tablet Take 10 mg by mouth daily. Reported on 01/23/2016     No current facility-administered medications on file prior to visit.    BP 120/70 mmHg  Pulse 75  Temp(Src) 98 F (36.7 C) (Oral)  Ht  (1.854 m)  Wt 205 lb 6.4 oz (93.169 kg)  BMI 27.11 kg/m2  SpO2 98%    Objective:   Physical Exam  Constitutional: He appears well-nourished.  HENT:  Right Ear: Tympanic membrane and ear canal normal.  Left Ear: Tympanic membrane and ear canal normal.  Nose: Mucosal edema present. Right sinus exhibits maxillary sinus tenderness and frontal sinus tenderness. Left sinus exhibits maxillary sinus tenderness and frontal sinus tenderness.  Mouth/Throat: Oropharynx is clear and moist.  Moderate to severe edema to left nares.  Neck: Neck supple.  Cardiovascular: Normal rate and regular rhythm.   Pulmonary/Chest: Effort normal and breath sounds normal. He has no wheezes. He has no rales.  Skin: Skin is warm and dry.          Assessment & Plan:

## 2016-02-01 ENCOUNTER — Ambulatory Visit (INDEPENDENT_AMBULATORY_CARE_PROVIDER_SITE_OTHER)
Admission: RE | Admit: 2016-02-01 | Discharge: 2016-02-01 | Disposition: A | Discharge status: Home / Self Care | Payer: Managed Care, Other (non HMO) | Source: Ambulatory Visit | Attending: Primary Care | Admitting: Primary Care

## 2016-02-01 DIAGNOSIS — J3489 Other specified disorders of nose and nasal sinuses: Secondary | ICD-10-CM | POA: Diagnosis not present

## 2016-02-06 ENCOUNTER — Ambulatory Visit (INDEPENDENT_AMBULATORY_CARE_PROVIDER_SITE_OTHER): Payer: Managed Care, Other (non HMO) | Admitting: Primary Care

## 2016-02-06 ENCOUNTER — Encounter: Payer: Self-pay | Admitting: Primary Care

## 2016-02-06 VITALS — BP 138/78 | HR 72 | Temp 97.8°F | Ht 73.0 in | Wt 203.1 lb

## 2016-02-06 DIAGNOSIS — J320 Chronic maxillary sinusitis: Secondary | ICD-10-CM

## 2016-02-06 DIAGNOSIS — J3489 Other specified disorders of nose and nasal sinuses: Secondary | ICD-10-CM

## 2016-02-06 DIAGNOSIS — K5732 Diverticulitis of large intestine without perforation or abscess without bleeding: Secondary | ICD-10-CM | POA: Diagnosis not present

## 2016-02-06 NOTE — Progress Notes (Signed)
Pre visit review using our clinic review tool, if applicable. No additional management support is needed unless otherwise documented below in the visit note. 

## 2016-02-06 NOTE — Patient Instructions (Signed)
Stop by the front desk and speak with either Shirlee LimerickMarion or Revonda StandardAllison regarding your referral to ENT.   Continue nasal sprays and Claritin as discussed. Please notify me if you have difficulty getting into their clinic.   Please schedule a physical with me in November 2017. You may also schedule a lab only appointment 3-4 days prior. We will discuss your lab results in detail during your physical.  It was a pleasure to see you today!

## 2016-02-06 NOTE — Progress Notes (Signed)
Subjective:    Patient ID: Darrell Lawson, male    DOB: 06/11/1971, 45 y.o.   MRN: [id number]  HPI  Darrell Lawson is a 45 year old male who presents today to establish care and discuss the problems mentioned below. Will obtain review records. His last physical was November 2016.   1) Diverticulosis/Diverticulitis: Frequent episodes in 2009. Found on colonoscopy in 2009. He tries to control symptoms by reducing fried/fatty foods, nuts, seeds. His last episode was 5 months ago which did not require medication.   2) Frequent Headaches/Migraines: Present for numerous months since sinus issues. He will experience headaches every other day. Migraines occur once every 2 weeks with photophobia. His headaches are mainly located to the frontal lobe and behind nasal cavity. He will typically take tylenol, occasionally aleve, with improvement. Denies nausea.  3) Sinus Pressure: Presented in late February 2017 to our practice with complaints of sinus pressure. He was evaluated by ENT who had difficulty ordering a CT Maxillofacial. He completed CT Maxillofacial last week and contacted their office for an appointment. He's not yet heard back from their office and is getting frustrated. Denies fevers, cough, feeling ill.   Review of Systems  Constitutional: Negative for fever and chills.  HENT: Positive for congestion and sinus pressure.   Respiratory: Negative for cough and shortness of breath.   Cardiovascular: Negative for chest pain.  Gastrointestinal: Negative for abdominal pain and diarrhea.  Neurological: Positive for headaches.       Past Medical History  Diagnosis Date  . Anxiety   . Gout   . Frequent headaches   . Diverticulitis large intestine     Social History   Social History  . Marital Status: Married    Spouse Name: N/A  . Number of Children: N/A  . Years of Education: N/A   Occupational History  . Not on file.   Social History Main Topics  . Smoking status: Never Smoker     . Smokeless tobacco: Never Used  . Alcohol Use: No  . Drug Use: No  . Sexual Activity: Not on file   Other Topics Concern  . Not on file   Social History Narrative    Past Surgical History  Procedure Laterality Date  . Eye surgery Left     age 89    Family History  Problem Relation Age of Onset  . Cancer Maternal Grandmother     breast  . Hypertension Father   . Hypertension Mother   . Heart disease Father   . Diabetes Father     No Known Allergies  No current outpatient prescriptions on file prior to visit.   No current facility-administered medications on file prior to visit.    BP 138/78 mmHg  Pulse 72  Temp(Src) 97.8 F (36.6 C) (Oral)  Ht 6\' 1"  (1.854 m)  Wt 203 lb 1.9 oz (92.135 kg)  BMI 26.80 kg/m2  SpO2 99%     Objective:   Physical Exam  Constitutional: He appears well-nourished.  HENT:  Right Ear: Tympanic membrane and ear canal normal.  Left Ear: Tympanic membrane and ear canal normal.  Nose: Mucosal edema and septal deviation present. Right sinus exhibits maxillary sinus tenderness. Left sinus exhibits maxillary sinus tenderness.  Mouth/Throat: Oropharynx is clear and moist.  Cardiovascular: Normal rate and regular rhythm.   Pulmonary/Chest: Effort normal and breath sounds normal.  Skin: Skin is warm and dry.          Assessment & Plan:

## 2016-02-07 NOTE — Assessment & Plan Note (Addendum)
Chronic for several months. CT Maxillofacial completed last week with evidence of nasal septal deviation and chronic sinusitis. Suspect headaches are originating from sinusitis.  Referral placed today for ENT evaluation.

## 2016-02-07 NOTE — Assessment & Plan Note (Signed)
Diagnosed in 2009 per colonoscopy. Last flare 5 months ago which did not require antibiotic treatment. Discussed trigger foods and he's aware.

## 2016-02-19 ENCOUNTER — Encounter: Payer: Self-pay | Admitting: Primary Care

## 2016-06-26 ENCOUNTER — Telehealth: Payer: Self-pay

## 2016-06-26 NOTE — Telephone Encounter (Signed)
Pt left vm; pt said was advised if had another diverticulitis flair up to call and Mayra Reel NP would put pt on abx. Pt request cb.[facility]  [place].Please advise.

## 2016-06-26 NOTE — Telephone Encounter (Signed)
Message left for patient to return my call.  

## 2016-06-26 NOTE — Telephone Encounter (Signed)
Spoken and notified patient of Kate's comments. Patient verbalized understanding.  Patient stated that he cannot come in until next and he will figure out something else.

## 2016-06-26 NOTE — Telephone Encounter (Signed)
I apologize if he misunderstood but I will need to see him in the office before I prescribe any treatment so I can get a better picture of what's going on.

## 2016-08-20 ENCOUNTER — Ambulatory Visit (INDEPENDENT_AMBULATORY_CARE_PROVIDER_SITE_OTHER): Payer: Managed Care, Other (non HMO) | Admitting: Primary Care

## 2016-08-20 ENCOUNTER — Encounter: Payer: Self-pay | Admitting: Primary Care

## 2016-08-20 VITALS — BP 144/92 | HR 80 | Temp 98.4°F | Ht 73.0 in | Wt 219.0 lb

## 2016-08-20 DIAGNOSIS — F411 Generalized anxiety disorder: Secondary | ICD-10-CM | POA: Insufficient documentation

## 2016-08-20 DIAGNOSIS — J320 Chronic maxillary sinusitis: Secondary | ICD-10-CM | POA: Diagnosis not present

## 2016-08-20 DIAGNOSIS — J019 Acute sinusitis, unspecified: Secondary | ICD-10-CM

## 2016-08-20 MED ORDER — AMOXICILLIN-POT CLAVULANATE 875-125 MG PO TABS: 1.0000 | ORAL_TABLET | Freq: Two times a day (BID) | ORAL | 0 refills | Status: DC

## 2016-08-20 MED ORDER — ESCITALOPRAM OXALATE 10 MG PO TABS: 10.0000 mg | ORAL_TABLET | Freq: Every day | ORAL | 1 refills | Status: DC

## 2016-08-20 NOTE — Progress Notes (Signed)
Subjective:    Patient ID: Darrell Lawson, male    DOB: 10/10/1971, 45 y.o.   MRN: [id number]  HPI  Darrell Lawson is a 45 year old male with a history of chronic sinusitis who presents today with a chief complaint of sinus pressure. She also reports nasal congestion, headache, and left ear pain. His symptoms have been present for the past 3 weeks. His sinus pressure is mainly located to the left side. Over the past 1 week he's noticed increased pressure to the left side with ear pain and fatigue. Denies fevers, chills, sore throat, cough. He's been using Flonase without much improvement.   2) Anxiety: Long history of daily worry, but has overall been able to manage. Over the past 2-3 months he's noticed anxiety with increased worry and panic attacks.  He will experience chest tightness, dizziness, with weakness to his arms several times daily and has done so for the past 2-3 months. Denies increased stress at work or changes in his home life. He attempts self calming techniques with some improvement, but his anxiety is becoming a problem in his daily life.  GAD 7 score of 16 today. Denies depression, tearfulness, SI/HI. He would like treatment for his anxiety.  Review of Systems  Constitutional: Positive for fatigue. Negative for chills and fever.  HENT: Positive for congestion, ear pain, facial swelling and sinus pressure. Negative for sore throat.   Respiratory: Negative for cough and shortness of breath.   Cardiovascular: Positive for chest pain and palpitations.  Neurological: Positive for dizziness.  Psychiatric/Behavioral: Negative for suicidal ideas. The patient is nervous/anxious.        Past Medical History:  Diagnosis Date  . Anxiety   . Diverticulitis large intestine   . Frequent headaches   . Gout      Social History   Social History  . Marital status: Married    Spouse name: N/A  . Number of children: N/A  . Years of education: N/A   Occupational History  . Not on  file.   Social History Main Topics  . Smoking status: Never Smoker  . Smokeless tobacco: Never Used  . Alcohol use No  . Drug use: No  . Sexual activity: Not on file   Other Topics Concern  . Not on file   Social History Narrative  . No narrative on file    Past Surgical History:  Procedure Laterality Date  . EYE SURGERY Left    age 89    Family History  Problem Relation Age of Onset  . Cancer Maternal Grandmother     breast  . Hypertension Father   . Hypertension Mother   . Heart disease Father   . Diabetes Father     No Known Allergies  No current outpatient prescriptions on file prior to visit.   No current facility-administered medications on file prior to visit.     BP (!) 144/92   Pulse 80   Temp 98.4 F (36.9 C) (Oral)   Ht 6\' 1"  (1.854 m)   Wt 219 lb (99.3 kg)   SpO2 98%   BMI 28.89 kg/m    Objective:   Physical Exam  Constitutional: He appears well-nourished.  HENT:  Right Ear: Tympanic membrane and ear canal normal.  Left Ear: Tympanic membrane and ear canal normal.  Nose: Mucosal edema present. Left sinus exhibits maxillary sinus tenderness and frontal sinus tenderness.  Mouth/Throat: Oropharynx is clear and moist.  Mild swelling over left maxillary sinus,  tender.  Eyes: Conjunctivae are normal.  Neck: Neck supple.  Cardiovascular: Normal rate and regular rhythm.   Pulmonary/Chest: Effort normal and breath sounds normal. He has no wheezes. He has no rales.  Skin: Skin is warm and dry.  Psychiatric: He has a normal mood and affect.          Assessment & Plan:

## 2016-08-20 NOTE — Progress Notes (Signed)
Pre visit review using our clinic review tool, if applicable. No additional management support is needed unless otherwise documented below in the visit note. 

## 2016-08-20 NOTE — Assessment & Plan Note (Signed)
Acute infection today. Left sided pressure, swelling gradually over 3 weeks, increased symptoms 1 week ago. Treat with Augmentin course. Continue Flonase.  Patient evaluated by ENT who recommended nasal sinus surgery for deviated septum.

## 2016-08-20 NOTE — Patient Instructions (Signed)
Start Augmentin antibiotics. Take 1 tablet by mouth twice daily for 10 days.  Start Lexapro tablets for anxiety. Take 1/2 tablet by mouth once daily for 8 days, then advance to 1 full tablet thereafter.  Follow up in 6 weeks for further evaluation.

## 2016-08-20 NOTE — Assessment & Plan Note (Signed)
Long history of worry for years, increased anxiety and worry daily over the past 2-3 months. GAD 7 score of 16 today. Denies SI/HI, depression. Long discussion today regarding treatment options, he elects for daily medication.  Start Lexapro 10 mg. Patient is to take 1/2 tablet daily for 8 days, then advance to 1 full tablet thereafter. We discussed possible side effects of headache, GI upset, drowsiness, and SI/HI. If thoughts of SI/HI develop, we discussed to present to the emergency immediately. Patient verbalized understanding.   Follow up in 6 weeks for re-evaluation.

## 2016-09-25 ENCOUNTER — Other Ambulatory Visit (INDEPENDENT_AMBULATORY_CARE_PROVIDER_SITE_OTHER): Payer: Managed Care, Other (non HMO)

## 2016-09-25 ENCOUNTER — Other Ambulatory Visit: Payer: Self-pay | Admitting: Primary Care

## 2016-09-25 DIAGNOSIS — Z0000 Encounter for general adult medical examination without abnormal findings: Secondary | ICD-10-CM

## 2016-09-25 LAB — COMPREHENSIVE METABOLIC PANEL
ALT: 18 U/L (ref 0–53)
AST: 15 U/L (ref 0–37)
Albumin: 4.5 g/dL (ref 3.5–5.2)
Alkaline Phosphatase: 42 U/L (ref 39–117)
BUN: 14 mg/dL (ref 6–23)
CHLORIDE: 101 meq/L (ref 96–112)
CO2: 30 meq/L (ref 19–32)
Calcium: 9.6 mg/dL (ref 8.4–10.5)
Creatinine, Ser: 1.04 mg/dL (ref 0.40–1.50)
GFR: 81.88 mL/min (ref >60.00)
GLUCOSE: 80 mg/dL (ref 70–99)
POTASSIUM: 4 meq/L (ref 3.5–5.1)
SODIUM: 138 meq/L (ref 135–145)
Total Bilirubin: 0.6 mg/dL (ref 0.2–1.2)
Total Protein: 7.1 g/dL (ref 6.0–8.3)

## 2016-09-25 LAB — LIPID PANEL
CHOLESTEROL: 187 mg/dL (ref 0–200)
HDL: 49.1 mg/dL (ref >39.00)
LDL CALC: 113 mg/dL — AB (ref 0–99)
NonHDL: 138.34
Total CHOL/HDL Ratio: 4
Triglycerides: 126 mg/dL (ref 0.0–149.0)
VLDL: 25.2 mg/dL (ref 0.0–40.0)

## 2016-09-25 LAB — HEMOGLOBIN A1C: HEMOGLOBIN A1C: 5.5 % (ref 4.6–6.5)

## 2016-10-01 ENCOUNTER — Ambulatory Visit (INDEPENDENT_AMBULATORY_CARE_PROVIDER_SITE_OTHER): Payer: Managed Care, Other (non HMO) | Admitting: Primary Care

## 2016-10-01 ENCOUNTER — Encounter: Payer: Self-pay | Admitting: Primary Care

## 2016-10-01 VITALS — BP 122/78 | HR 92 | Temp 98.1°F | Ht 73.0 in | Wt 218.0 lb

## 2016-10-01 DIAGNOSIS — K5732 Diverticulitis of large intestine without perforation or abscess without bleeding: Secondary | ICD-10-CM

## 2016-10-01 DIAGNOSIS — Z0000 Encounter for general adult medical examination without abnormal findings: Secondary | ICD-10-CM | POA: Diagnosis not present

## 2016-10-01 DIAGNOSIS — K5792 Diverticulitis of intestine, part unspecified, without perforation or abscess without bleeding: Secondary | ICD-10-CM

## 2016-10-01 DIAGNOSIS — Z0001 Encounter for general adult medical examination with abnormal findings: Secondary | ICD-10-CM

## 2016-10-01 DIAGNOSIS — F411 Generalized anxiety disorder: Secondary | ICD-10-CM

## 2016-10-01 DIAGNOSIS — Z23 Encounter for immunization: Secondary | ICD-10-CM

## 2016-10-01 DIAGNOSIS — J320 Chronic maxillary sinusitis: Secondary | ICD-10-CM

## 2016-10-01 MED ORDER — AMOXICILLIN-POT CLAVULANATE 875-125 MG PO TABS: 1.0000 | ORAL_TABLET | Freq: Two times a day (BID) | ORAL | 0 refills | Status: DC

## 2016-10-01 NOTE — Patient Instructions (Addendum)
Start Augmentin antibiotics for acute diverticulitis. Take 1 tablet by mouth twice daily for 10 days.  Start a clear liquid diet today. Continue until symptoms have resolved. Take a look at the information below.   You were provided with an influenza vaccination today.  It's importance to improve your diet by reducing consumption of fast food, fried food, processed snack foods, sugary drinks. Increase consumption of fresh vegetables and fruits, whole grains, water.  Ensure you are drinking 64 ounces of water daily.  Start exercising. You should be getting 150 minutes of moderate intensity exercise weekly.  Follow up in 1 year for complete physical or sooner if needed.  It was a pleasure to see you today!  Clear Liquid Diet A clear liquid diet is a short-term diet that is prescribed to provide the necessary fluid and basic energy you need when you can have nothing else. The clear liquid diet consists of liquids or solids that will become liquid at room temperature. You should be able to see through the liquid. There are many reasons that you may be restricted to clear liquids, such as:  When you have a sudden-onset (acute) condition that occurs before or after surgery.  To help your body slowly get adjusted to food again after a long period when you were unable to have food.  Replacement of fluids when you have a diarrheal disease.  When you are going to have certain exams, such as a colonoscopy, in which instruments are inserted inside your body to look at parts of your digestive system. WHAT CAN I HAVE? A clear liquid diet does not provide all the nutrients you need. It is important to choose a variety of the following items to get as many nutrients as possible:  Vegetable juices that do not have pulp.  Fruit juices and fruit drinks that do not have pulp.  Coffee (regular or decaffeinated), tea, or soda at the discretion of your health care provider.  Clear bouillon, broth, or  strained broth-based soups.  High-protein and flavored gelatins.  Sugar or honey.  Ices or frozen ice pops that do not contain milk. If you are not sure whether you can have certain items, you should ask your health care provider. You may also ask your health care provider if there are any other clear liquid options.   This information is not intended to replace advice given to you by your health care provider. Make sure you discuss any questions you have with your health care provider.   Document Released: 11/17/2005 Document Revised: 11/22/2013 Document Reviewed: 10/14/2013 Elsevier Interactive Patient Education [employer]2016 [employer]vier Inc.

## 2016-10-01 NOTE — Progress Notes (Signed)
Pre visit review using our clinic review tool, if applicable. No additional management support is needed unless otherwise documented below in the visit note. 

## 2016-10-01 NOTE — Assessment & Plan Note (Signed)
Immunizations UTD, influenza vaccination provided today. Overall fair diet, discussed to decrease fast food. Increase vegetables, fruit, whole grains. Discussed to start regular exercise. Exam with evidence of acute diverticulitis which was treated. Labs unremarkable.  Follow up in 1 year for repeat physical.

## 2016-10-01 NOTE — Assessment & Plan Note (Signed)
Much improved on Lexapro.  Denies SI/HI.  Continue same.

## 2016-10-01 NOTE — Progress Notes (Signed)
Subjective:    Patient ID: Darrell Lawson, male    DOB: 02/13/1971, 45 y.o.   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  Immunizations: -Tetanus: Completed within 10 years -Influenza: Due today.   Diet: He endorses a fair diet. Breakfast: Skips Lunch: Fast food Dinner: Soup, sandwich, little vegetable consumption Snacks: None Desserts: Occasionally after dinner Beverages: Water, Gatorade, Soda  Exercise: He does not currently exercise. Active at work. Eye exam: Completed in 2016 Dental exam: Completes 2-3 years.    Review of Systems  Constitutional: Negative for unexpected weight change.  HENT: Positive for postnasal drip and sinus pressure. Negative for congestion and rhinorrhea.        He's using saline nasal spray now. No improvement with antihistamines and steroid nasal sprays.  Respiratory: Negative for cough and shortness of breath.   Cardiovascular: Negative for chest pain.  Gastrointestinal: Positive for abdominal pain. Negative for constipation, diarrhea, nausea and vomiting.       Located to LLQ/mid lower abdomen that began 3 days ago. He denies nausea/vomiting, rectal bleeding, fevers. He's not taken anything OTC for his symptoms. He denies changes in his diet.  Genitourinary: Negative for difficulty urinating.  Musculoskeletal: Negative for arthralgias and myalgias.  Skin: Negative for rash.  Allergic/Immunologic: Negative for environmental allergies.  Neurological: Negative for dizziness, numbness and headaches.  Psychiatric/Behavioral:       He's noticed much improvement on Lexapro with reduction in anxiety.        Past Medical History:  Diagnosis Date  . Anxiety   . Diverticulitis large intestine   . Frequent headaches   . Gout      Social History   Social History  . Marital status: Married    Spouse name: N/A  . Number of children: N/A  . Years of education: N/A   Occupational History    . Not on file.   Social History Main Topics  . Smoking status: Never Smoker  . Smokeless tobacco: Never Used  . Alcohol use No  . Drug use: No  . Sexual activity: Not on file   Other Topics Concern  . Not on file   Social History Narrative  . No narrative on file    Past Surgical History:  Procedure Laterality Date  . EYE SURGERY Left    age 89    Family History  Problem Relation Age of Onset  . Cancer Maternal Grandmother     breast  . Hypertension Father   . Hypertension Mother   . Heart disease Father   . Diabetes Father     No Known Allergies  Current Outpatient Prescriptions on File Prior to Visit  Medication Sig Dispense Refill  . escitalopram (LEXAPRO) 10 MG tablet Take 1 tablet (10 mg total) by mouth daily. 30 tablet 1   No current facility-administered medications on file prior to visit.     BP 122/78   Pulse 92   Temp 98.1 F (36.7 C) (Oral)   Ht 6\' 1"  (1.854 m)   Wt 218 lb (98.9 kg)   SpO2 98%   BMI 28.76 kg/m    Objective:   Physical Exam  Constitutional: He is oriented to person, place, and time. He appears well-nourished.  HENT:  Right Ear: Tympanic membrane and ear canal normal.  Left Ear: Tympanic membrane and ear canal normal.  Nose: Mucosal edema present. Right sinus exhibits no maxillary sinus tenderness and  no frontal sinus tenderness. Left sinus exhibits no maxillary sinus tenderness and no frontal sinus tenderness.  Mouth/Throat: Oropharynx is clear and moist.  Eyes: Conjunctivae and EOM are normal. Pupils are equal, round, and reactive to light.  Neck: Neck supple. Carotid bruit is not present. No thyromegaly present.  Cardiovascular: Normal rate, regular rhythm and normal heart sounds.   Pulmonary/Chest: Effort normal and breath sounds normal. He has no wheezes. He has no rales.  Abdominal: Soft. Bowel sounds are normal. There is tenderness in the left lower quadrant. There is no tenderness at McBurney's point and negative  Murphy's sign.  Musculoskeletal: Normal range of motion.  Neurological: He is alert and oriented to person, place, and time. He has normal reflexes. No cranial nerve deficit.  Skin: Skin is warm and dry.  Psychiatric: He has a normal mood and affect.          Assessment & Plan:

## 2016-10-01 NOTE — Assessment & Plan Note (Signed)
Continued sinus pressure. Cannot afford ENT surgery. No improvement on OTC treatment. Will continue to follow with ENT.

## 2016-10-01 NOTE — Assessment & Plan Note (Signed)
Abdominal pain x 3 days. Exam with moderate tenderness to LLQ and suprapubic region. No urinary symptoms. Given history and presentation will treat for presumed acute diverticulitis. Augmentin course sent to pharmacy. Also discussed clear liquid diet.

## 2016-10-02 MED ORDER — ESCITALOPRAM OXALATE 10 MG PO TABS: 10.0000 mg | ORAL_TABLET | Freq: Every day | ORAL | 2 refills | Status: DC

## 2016-10-02 NOTE — Addendum Note (Signed)
Addended by: Tawnya CrookSAMBATH, Karle Desrosier on: 10/02/2016 08:46 AM   Modules accepted: Orders

## 2016-10-04 ENCOUNTER — Emergency Department
Admission: EM | Admit: 2016-10-04 | Discharge: 2016-10-04 | Disposition: A | Discharge status: Home / Self Care | Payer: Managed Care, Other (non HMO) | Attending: Emergency Medicine | Admitting: Emergency Medicine

## 2016-10-04 ENCOUNTER — Encounter: Payer: Self-pay | Admitting: *Deleted

## 2016-10-04 ENCOUNTER — Emergency Department: Payer: Managed Care, Other (non HMO)

## 2016-10-04 DIAGNOSIS — Z79899 Other long term (current) drug therapy: Secondary | ICD-10-CM | POA: Diagnosis not present

## 2016-10-04 DIAGNOSIS — M25472 Effusion, left ankle: Secondary | ICD-10-CM

## 2016-10-04 DIAGNOSIS — M25572 Pain in left ankle and joints of left foot: Secondary | ICD-10-CM | POA: Diagnosis present

## 2016-10-04 MED ORDER — NAPROXEN 500 MG PO TABS
500.0000 mg | ORAL_TABLET | Freq: Once | ORAL | Status: AC
  Administered 2016-10-04: 500 mg via ORAL
  Filled 2016-10-04: qty 1

## 2016-10-04 MED ORDER — COLCHICINE 0.6 MG PO TABS
1.2000 mg | ORAL_TABLET | Freq: Once | ORAL | Status: AC
  Administered 2016-10-04: 1.2 mg via ORAL
  Filled 2016-10-04: qty 2

## 2016-10-04 MED ORDER — HYDROCODONE-ACETAMINOPHEN 5-325 MG PO TABS: 1.0000 | ORAL_TABLET | ORAL | 0 refills | Status: DC | PRN

## 2016-10-04 MED ORDER — MELOXICAM 15 MG PO TABS: 15.0000 mg | ORAL_TABLET | Freq: Every day | ORAL | 0 refills | Status: DC

## 2016-10-04 MED ORDER — HYDROCODONE-ACETAMINOPHEN 5-325 MG PO TABS
1.0000 | ORAL_TABLET | Freq: Once | ORAL | Status: AC
  Administered 2016-10-04: 1 via ORAL
  Filled 2016-10-04: qty 1

## 2016-10-04 NOTE — Discharge Instructions (Signed)
Follow up with the foot and ankle specialist. Rest, ice, and elevate the foot as often as possible. Take the medications as prescribed. Return to the ER for symptoms that change or worsen or for new concerns is unable to see the PCP or specialist.

## 2016-10-04 NOTE — ED Provider Notes (Signed)
[facility] Emergency Department Provider Note ____________________________________________  Time seen: Approximately 6:38 PM  I have reviewed the triage vital signs and the nursing notes.   HISTORY  Chief Complaint Foot Pain    HPI Darrell Lawson is a 45 y.o. male who presents to the emergency department for evaluation of left foot and ankle pain. He states that he worked today and after his shower noticed that his foot and ankle hurt a little. As the day has progressed and he has walked around, the pain has gotten intense. He has a history of gout in the left knee, and states this pain is very similar. He denies injury. He states that he wears steeltoe boots at work and is in and out of a truck all day. He rubbed some Absorbine Jr. On the ankle, but has otherwise not taken any medications.  Past Medical History:  Diagnosis Date  . Anxiety   . Diverticulitis large intestine   . Frequent headaches   . Gout     Patient Active Problem List   Diagnosis Date Noted  . Encounter for annual general medical examination with abnormal findings in adult 10/01/2016  . GAD (generalized anxiety disorder) 08/20/2016  . Chronic maxillary sinusitis 01/23/2016  . Diverticulitis of colon 10/12/2013    Past Surgical History:  Procedure Laterality Date  . EYE SURGERY Left    age 89    Prior to Admission medications   Medication Sig Start Date End Date Taking? Authorizing Provider  amoxicillin-clavulanate (AUGMENTIN) 875-125 MG tablet Take 1 tablet by mouth 2 (two) times daily. 10/01/16   Doreene NestKatherine K Clark, NP  escitalopram (LEXAPRO) 10 MG tablet Take 1 tablet (10 mg total) by mouth daily. 10/02/16   Doreene NestKatherine K Clark, NP  HYDROcodone-acetaminophen (NORCO/VICODIN) 5-325 MG tablet Take 1 tablet by mouth every 4 (four) hours as needed for moderate pain. 10/04/16 10/04/17  Chinita Pesterari B Zyrell Carmean, FNP  meloxicam (MOBIC) 15 MG tablet Take 1 tablet (15 mg total) by mouth daily. 10/04/16    Chinita Pesterari B Catherine Cubero, FNP    Allergies Patient has no known allergies.  Family History  Problem Relation Age of Onset  . Hypertension Mother   . Hypertension Father   . Heart disease Father   . Diabetes Father   . Cancer Maternal Grandmother     breast    Social History Social History  Substance Use Topics  . Smoking status: Never Smoker  . Smokeless tobacco: Never Used  . Alcohol use No    Review of Systems Constitutional: No recent illness. Cardiovascular: Denies chest pain or palpitations. Respiratory: Denies shortness of breath. Musculoskeletal: Pain in left ankle and foot. Skin: Negative for rash, wound, lesion. Neurological: Negative for focal weakness or numbness.  ____________________________________________   PHYSICAL EXAM:  VITAL SIGNS: ED Triage Vitals [10/04/16 1758]  Enc Vitals Group     BP 128/79     Pulse Rate 88     Resp 18     Temp 98.7 F (37.1 C)     Temp Source Oral     SpO2      Weight 218 lb (98.9 kg)     Height 6\' 1"  (1.854 m)     Head Circumference      Peak Flow      Pain Score 9     Pain Loc      Pain Edu?      Excl. in GC?     Constitutional: Alert and oriented. Well appearing and  in no acute distress. Eyes: Conjunctivae are normal. EOMI. Head: Atraumatic. Neck: No stridor.  Respiratory: Normal respiratory effort.   Musculoskeletal: Tenderness and swelling over the lateral malleolus of the left ankle.  Neurologic:  Normal speech and language. No gross focal neurologic deficits are appreciated. Speech is normal. No gait instability. Skin:  Skin is warm, dry and intact. Atraumatic. Psychiatric: Mood and affect are normal. Speech and behavior are normal.  ____________________________________________   LABS (all labs ordered are listed, but only abnormal results are displayed)  Labs Reviewed - No data to display ____________________________________________  RADIOLOGY  Questionable tibiotalar joint  effusion. ____________________________________________   PROCEDURES  Procedure(s) performed: Velcro ankle stirrup splint applied by ER tech. Patient was neurovascularly intact post application.    ____________________________________________   INITIAL IMPRESSION / ASSESSMENT AND PLAN / ED COURSE  Clinical Course     Pertinent labs & imaging results that were available during my care of the patient were reviewed by me and considered in my medical decision making (see chart for details).  ----------------------------------------- 7:38 PM on 10/04/2016 -----------------------------------------  No improvement after colchicine. We will get x-ray and order Norco.  9:40 PM  Patient discharged home to follow up with Podiatry. He is to rest, ice, and elevate the foot. He is to return to the ER for symptoms that change or worsen if unable to schedule an appointment. ____________________________________________   FINAL CLINICAL IMPRESSION(S) / ED DIAGNOSES  Final diagnoses:  Ankle joint effusion, left       Chinita PesterCari B Finlee Milo, FNP 10/05/16 1642    Myrna Blazeravid Matthew Schaevitz, MD 10/05/16 [phone] [id number]

## 2016-10-04 NOTE — ED Triage Notes (Signed)
Patient denies trauma or injury to the left foot. Patient does have a history of gout. Patient states pain began as an ache and within 4 hours became painful and swollen

## 2016-10-05 NOTE — ED Provider Notes (Signed)
[facility] Emergency Department Provider Note ____________________________________________  Time seen: Approximately 6:38 PM  I have reviewed the triage vital signs and the nursing notes.   HISTORY  Chief Complaint Foot Pain    HPI Darrell Lawson is a 45 y.o. male who presents to the emergency department for evaluation of left foot and ankle pain. He states that he worked today and after his shower noticed that his foot and ankle hurt a little. As the day has progressed and he has walked around, the pain has gotten intense. He has a history of gout in the left knee, and states this pain is very similar. He denies injury. He states that he wears steeltoe boots at work and is in and out of a truck all day. He rubbed some Absorbine Jr. On the ankle, but has otherwise not taken any medications.  Past Medical History:  Diagnosis Date  . Anxiety   . Diverticulitis large intestine   . Frequent headaches   . Gout     Patient Active Problem List   Diagnosis Date Noted  . Encounter for annual general medical examination with abnormal findings in adult 10/01/2016  . GAD (generalized anxiety disorder) 08/20/2016  . Chronic maxillary sinusitis 01/23/2016  . Diverticulitis of colon 10/12/2013    Past Surgical History:  Procedure Laterality Date  . EYE SURGERY Left    age 89    Prior to Admission medications   Medication Sig Start Date End Date Taking? Authorizing Provider  amoxicillin-clavulanate (AUGMENTIN) 875-125 MG tablet Take 1 tablet by mouth 2 (two) times daily. 10/01/16   Doreene NestKatherine K Clark, NP  escitalopram (LEXAPRO) 10 MG tablet Take 1 tablet (10 mg total) by mouth daily. 10/02/16   Doreene NestKatherine K Clark, NP  HYDROcodone-acetaminophen (NORCO/VICODIN) 5-325 MG tablet Take 1 tablet by mouth every 4 (four) hours as needed for moderate pain. 10/04/16 10/04/17  Chinita Pesterari B Donnielle Addison, FNP  meloxicam (MOBIC) 15 MG tablet Take 1 tablet (15 mg total) by mouth daily. 10/04/16    Chinita Pesterari B Eldora Napp, FNP    Allergies Review of patient's allergies indicates no known allergies.  Family History  Problem Relation Age of Onset  . Hypertension Mother   . Hypertension Father   . Heart disease Father   . Diabetes Father   . Cancer Maternal Grandmother     breast    Social History Social History  Substance Use Topics  . Smoking status: Never Smoker  . Smokeless tobacco: Never Used  . Alcohol use No    Review of Systems Constitutional: No recent illness. Cardiovascular: Denies chest pain or palpitations. Respiratory: Denies shortness of breath. Musculoskeletal: Pain in left ankle and foot. Skin: Negative for rash, wound, lesion. Neurological: Negative for focal weakness or numbness.  ____________________________________________   PHYSICAL EXAM:  VITAL SIGNS: ED Triage Vitals [10/04/16 1758]  Enc Vitals Group     BP 128/79     Pulse Rate 88     Resp 18     Temp 98.7 F (37.1 C)     Temp Source Oral     SpO2      Weight 218 lb (98.9 kg)     Height 6\' 1"  (1.854 m)     Head Circumference      Peak Flow      Pain Score 9     Pain Loc      Pain Edu?      Excl. in GC?     Constitutional: Alert and oriented.  Well appearing and in no acute distress. Eyes: Conjunctivae are normal. EOMI. Head: Atraumatic. Neck: No stridor.  Respiratory: Normal respiratory effort.   Musculoskeletal: Tenderness and swelling over the lateral malleolus of the left ankle.  Neurologic:  Normal speech and language. No gross focal neurologic deficits are appreciated. Speech is normal. No gait instability. Skin:  Skin is warm, dry and intact. Atraumatic. Psychiatric: Mood and affect are normal. Speech and behavior are normal.  ____________________________________________   LABS (all labs ordered are listed, but only abnormal results are displayed)  Labs Reviewed - No data to display ____________________________________________  RADIOLOGY  Questionable tibiotalar  joint effusion on the left.  ____________________________________________   PROCEDURES  Procedure(s) performed: Velcro ankle stirrup splint applied to the left ankle by Sheralyn Boatmanoni, ER Tech.   ____________________________________________   INITIAL IMPRESSION / ASSESSMENT AND PLAN / ED COURSE  Clinical Course    Pertinent labs & imaging results that were available during my care of the patient were reviewed by me and considered in my medical decision making (see chart for details).  ----------------------------------------- 7:38 PM on 10/04/2016 -----------------------------------------  No improvement after colchicine. We will get x-ray and order Norco.  ----------------------------------------- 9:05 PM on 10/04/2016 -----------------------------------------  Patient to follow up with the podiatrist. He was advised to rest, ice, and elevate the foot and ankle for the next few days. Prescriptions for Norco and meloxicam  ____________________________________________   FINAL CLINICAL IMPRESSION(S) / ED DIAGNOSES  Final diagnoses:  Ankle joint effusion, left       Chinita PesterCari B Prathik Aman, FNP 10/05/16 0005    Myrna Blazeravid Matthew Schaevitz, MD 10/05/16 [phone]

## 2016-10-06 ENCOUNTER — Other Ambulatory Visit: Payer: Managed Care, Other (non HMO)

## 2016-10-09 ENCOUNTER — Encounter: Payer: Managed Care, Other (non HMO) | Admitting: Primary Care

## 2016-10-15 ENCOUNTER — Encounter: Payer: Self-pay | Admitting: Primary Care

## 2016-10-15 ENCOUNTER — Encounter: Payer: Self-pay | Admitting: Physician Assistant

## 2016-10-15 ENCOUNTER — Ambulatory Visit (INDEPENDENT_AMBULATORY_CARE_PROVIDER_SITE_OTHER): Payer: Managed Care, Other (non HMO) | Admitting: Primary Care

## 2016-10-15 VITALS — BP 126/84 | HR 84 | Temp 98.3°F | Ht 73.0 in | Wt 220.1 lb

## 2016-10-15 DIAGNOSIS — R1084 Generalized abdominal pain: Secondary | ICD-10-CM

## 2016-10-15 DIAGNOSIS — K625 Hemorrhage of anus and rectum: Secondary | ICD-10-CM | POA: Diagnosis not present

## 2016-10-15 LAB — CBC WITH DIFFERENTIAL/PLATELET
BASOS PCT: 0.7 % (ref 0.0–3.0)
Basophils Absolute: 0 10*3/uL (ref 0.0–0.1)
EOS PCT: 0.4 % (ref 0.0–5.0)
Eosinophils Absolute: 0 10*3/uL (ref 0.0–0.7)
HCT: 41 % (ref 39.0–52.0)
HEMOGLOBIN: 13.9 g/dL (ref 13.0–17.0)
Lymphocytes Relative: 26.4 % (ref 12.0–46.0)
Lymphs Abs: 1.3 10*3/uL (ref 0.7–4.0)
MCHC: 34 g/dL (ref 30.0–36.0)
MCV: 91.6 fl (ref 78.0–100.0)
MONO ABS: 0.4 10*3/uL (ref 0.1–1.0)
MONOS PCT: 7.8 % (ref 3.0–12.0)
Neutro Abs: 3.2 10*3/uL (ref 1.4–7.7)
Neutrophils Relative %: 64.7 % (ref 43.0–77.0)
Platelets: 341 10*3/uL (ref 150.0–400.0)
RBC: 4.48 Mil/uL (ref 4.22–5.81)
RDW: 13.2 % (ref 11.5–15.5)
WBC: 5 10*3/uL (ref 4.0–10.5)

## 2016-10-15 LAB — BASIC METABOLIC PANEL
BUN: 11 mg/dL (ref 6–23)
CALCIUM: 9.4 mg/dL (ref 8.4–10.5)
CO2: 28 mEq/L (ref 19–32)
CREATININE: 1 mg/dL (ref 0.40–1.50)
Chloride: 103 mEq/L (ref 96–112)
GFR: 85.65 mL/min (ref >60.00)
GLUCOSE: 92 mg/dL (ref 70–99)
Potassium: 4.3 mEq/L (ref 3.5–5.1)
SODIUM: 138 meq/L (ref 135–145)

## 2016-10-15 LAB — IFOBT (OCCULT BLOOD): IFOBT: POSITIVE

## 2016-10-15 MED ORDER — HYDROCORTISONE ACETATE 25 MG RE SUPP: 25.0000 mg | Freq: Two times a day (BID) | RECTAL | 0 refills | Status: DC

## 2016-10-15 NOTE — Progress Notes (Signed)
Subjective:    Patient ID: Darrell Lawson, male    DOB: 04/05/1971, 45 y.o.   MRN: [id number]  HPI  Mr. Darrell Lawson is a 45 year old male with a history of diverticulosis with acute diverticulitis who presents today with a chief complaint of rectal bleeding and abdominal pain. He was evaluated and treated for acute diverticulitis on 10/01/16 with an Augmentin course.   Since his last visit he's completed the Augmentin course. He's noticed bright red rectal bleeding for the past 2 weeks which first occured after straining for a bowel movement. Since then he's noticed rectal bleeding intermittently with bowel movements. He denies unexplained weight loss, night sweats, family history of colon cancer, fevers, diarrhea, constipation, changes in diet. He does continue to experience LLQ pain and is now with RLQ, epigastric pain.   Review of Systems  Constitutional: Negative for chills, fatigue and fever.  Gastrointestinal: Positive for abdominal pain and blood in stool. Negative for constipation, diarrhea, nausea and vomiting.  Neurological: Negative for weakness.       Past Medical History:  Diagnosis Date  . Anxiety   . Diverticulitis large intestine   . Frequent headaches   . Gout      Social History   Social History  . Marital status: Married    Spouse name: N/A  . Number of children: N/A  . Years of education: N/A   Occupational History  . Not on file.   Social History Main Topics  . Smoking status: Never Smoker  . Smokeless tobacco: Never Used  . Alcohol use No  . Drug use: No  . Sexual activity: Not on file   Other Topics Concern  . Not on file   Social History Narrative  . No narrative on file    Past Surgical History:  Procedure Laterality Date  . EYE SURGERY Left    age 89    Family History  Problem Relation Age of Onset  . Hypertension Mother   . Hypertension Father   . Heart disease Father   . Diabetes Father   . Cancer Maternal Grandmother     breast     No Known Allergies  Current Outpatient Prescriptions on File Prior to Visit  Medication Sig Dispense Refill  . escitalopram (LEXAPRO) 10 MG tablet Take 1 tablet (10 mg total) by mouth daily. 30 tablet 2  . HYDROcodone-acetaminophen (NORCO/VICODIN) 5-325 MG tablet Take 1 tablet by mouth every 4 (four) hours as needed for moderate pain. 12 tablet 0  . meloxicam (MOBIC) 15 MG tablet Take 1 tablet (15 mg total) by mouth daily. 30 tablet 0   No current facility-administered medications on file prior to visit.     BP 126/84   Pulse 84   Temp 98.3 F (36.8 C) (Oral)   Ht 6\' 1"  (1.854 m)   Wt 220 lb 1.9 oz (99.8 kg)   SpO2 98%   BMI 29.04 kg/m    Objective:   Physical Exam  Constitutional: He appears well-nourished. He does not appear ill. No distress.  Neck: Neck supple.  Cardiovascular: Normal rate and regular rhythm.   Pulmonary/Chest: Effort normal and breath sounds normal.  Abdominal: Soft. Normal appearance and bowel sounds are normal. There is tenderness in the right lower quadrant, epigastric area, suprapubic area and left lower quadrant.  Genitourinary: Rectal exam shows external hemorrhoid, internal hemorrhoid and guaiac positive stool.  Skin: Skin is warm and dry.          Assessment &  Plan:  Rectal Bleeding and Abdominal Pain:  Treated for presumed acute diverticulitis on 10/01/16, completed antibiotics. Exam today with mostly generalized tenderness to abdomen. Rectal exam with small external and internal hemorrhoid with positive guaiac test. Do not suspect blockage, bowel perforation, acute infection given presentation and exam. Will obtain CBC and BMP today for further evaluation. Treat hemorrhoids with rectal suppositories. Referral to GI for further evaluation as he may require colonoscopy. He is stable for outpatient treatment, return precautions provided.  Darrell Lawson,Darrell Laurich Kendal, NP

## 2016-10-15 NOTE — Patient Instructions (Signed)
Complete lab work prior to leaving today. I will notify you of your results once received.   You do have a small external and internal hemorrhoid which could be causing your bleeding. Insert 1 suppository into the rectum twice daily.  Stop by the front desk and speak with either Shirlee LimerickMarion or Revonda StandardAllison regarding your referral to GI for further evaluation.  Please call me sooner if you develop fevers, increased pain, vomiting, diarrhea, constipation.  It was a pleasure to see you today!

## 2016-10-15 NOTE — Progress Notes (Signed)
Pre visit review using our clinic review tool, if applicable. No additional management support is needed unless otherwise documented below in the visit note. 

## 2016-10-21 ENCOUNTER — Ambulatory Visit: Payer: Managed Care, Other (non HMO) | Admitting: Physician Assistant

## 2016-10-21 ENCOUNTER — Encounter: Payer: Self-pay | Admitting: Primary Care

## 2016-10-21 ENCOUNTER — Telehealth: Payer: Self-pay | Admitting: Physician Assistant

## 2016-10-22 ENCOUNTER — Other Ambulatory Visit: Payer: Self-pay | Admitting: Primary Care

## 2016-10-22 DIAGNOSIS — K625 Hemorrhage of anus and rectum: Secondary | ICD-10-CM

## 2016-10-22 MED ORDER — HYDROCORTISONE ACETATE 25 MG RE SUPP: 25.0000 mg | Freq: Two times a day (BID) | RECTAL | 0 refills | Status: DC

## 2016-10-28 ENCOUNTER — Other Ambulatory Visit: Payer: Self-pay | Admitting: Primary Care

## 2016-10-28 ENCOUNTER — Encounter: Payer: Self-pay | Admitting: Primary Care

## 2016-10-28 DIAGNOSIS — K625 Hemorrhage of anus and rectum: Secondary | ICD-10-CM

## 2016-11-17 ENCOUNTER — Ambulatory Visit (INDEPENDENT_AMBULATORY_CARE_PROVIDER_SITE_OTHER): Payer: Managed Care, Other (non HMO) | Admitting: Gastroenterology

## 2016-11-17 ENCOUNTER — Encounter: Payer: Self-pay | Admitting: Gastroenterology

## 2016-11-17 VITALS — BP 122/81 | HR 80 | Temp 98.6°F | Ht 73.0 in | Wt 223.0 lb

## 2016-11-17 DIAGNOSIS — K5732 Diverticulitis of large intestine without perforation or abscess without bleeding: Secondary | ICD-10-CM | POA: Diagnosis not present

## 2016-11-17 DIAGNOSIS — K625 Hemorrhage of anus and rectum: Secondary | ICD-10-CM | POA: Diagnosis not present

## 2016-11-17 NOTE — Progress Notes (Signed)
Gastroenterology Consultation  Referring Provider:     Pleas Koch, NP Primary Care Physician:  Sheral Flow, NP Primary Gastroenterologist:  Dr. Jonathon Bellows  Reason for Consultation:     Diverticulitis and rectal bleeding         HPI:   DONNELLE OLMEDA is a 45 y.o. y/o male referred for consultation & management  by Dr. Sheral Flow, NP.    He was seen by his primary care physician 10/15/16 for abdominal pain , rectal bleeding on 10/15/16 and hence was referred to GI for possible colonoscopy.  Labs 10/15/16 : HB 13.9, MCV 91 Cr 1.0, FOBT positive.    He says that he has had about 5-6 episodes of diverticulitis, usually develops abdominal pain below his belly button, usually he says he tries to get as much fiber as he can .Fast food brings it on quicker. Denies any associated fevers, chills,diarrhea. Just abdominal pain. Usually treated by his doctor with augmentin, helps after a few days. Last course of antibiotics was a month back . Presently has mild pain scale 2/10 , started yesterday . Last colonoscopy was 9 years back. No cancer in his family , father had benign colon polyps.  He says that he noticed rectal bleeding a month back , blood on top of his stools for 3-4  Days , was not passing hard stool , met his physician and was told he may have an irritated hemorroids and was commenced on a suppository that stopped his bleeding . He has noticed more narrow stools, "bigger than a pencil" ongoing for a few years/. n   Past Medical History:  Diagnosis Date  . Anxiety   . Diverticulitis large intestine   . Frequent headaches   . Gout     Past Surgical History:  Procedure Laterality Date  . EYE SURGERY Left    age 12    Prior to Admission medications   Medication Sig Start Date End Date Taking? Authorizing Provider  escitalopram (LEXAPRO) 10 MG tablet Take 1 tablet (10 mg total) by mouth daily. 10/02/16  Yes Pleas Koch, NP    Family History    Problem Relation Age of Onset  . Hypertension Mother   . Hypertension Father   . Heart disease Father   . Diabetes Father   . Cancer Maternal Grandmother     breast     Social History  Substance Use Topics  . Smoking status: Never Smoker  . Smokeless tobacco: Never Used  . Alcohol use No    Allergies as of 11/17/2016  . (No Known Allergies)    Review of Systems:    All systems reviewed and negative except where noted in HPI.   Physical Exam:  BP 122/81   Pulse 80   Temp 98.6 F (37 C) (Oral)   Ht 6' 1" (1.854 m)   Wt 223 lb (101.2 kg)   BMI 29.42 kg/m  No LMP for male patient. Psych:  Alert and cooperative. Normal mood and affect. General:   Alert,  Well-developed, well-nourished, pleasant and cooperative in NAD Head:  Normocephalic and atraumatic. Eyes:  Sclera clear, no icterus.   Conjunctiva pink. Ears:  Normal auditory acuity. Nose:  No deformity, discharge, or lesions. Mouth:  No deformity or lesions,oropharynx pink & moist. Neck:  Supple; no masses or thyromegaly. Lungs:  Respirations even and unlabored.  Clear throughout to auscultation.   No wheezes, crackles, or rhonchi. No acute distress. Heart:  Regular rate and rhythm;  no murmurs, clicks, rubs, or gallops. Abdomen:  Normal bowel sounds.  No bruits.  Soft, mild left lower quadrant tenderness non-distended without masses, hepatosplenomegaly or hernias noted.  No guarding or rebound tenderness.    Lymph Nodes:  No significant cervical adenopathy. Psych:  Alert and cooperative. Normal mood and affect.  Imaging Studies: No results found.  Assessment and Plan:   JUANCARLOS CRESCENZO is a 45 y.o. y/o male has been referred for recurrent diverticulitis and rectal bleeding . Presently he still has some persistent LLQ pain and tenderness. I will obtain a CT scan to rule out persistent inflammation/abscess. IF negative will set him up for a colonoscopy in 4 weeks time. At next visit will discuss possibility of future  surgery if having multiple episodes of diverticulitis.   Follow up in 8 weeks   Dr Jonathon Bellows MD

## 2016-11-17 NOTE — Patient Instructions (Signed)
Please refer below for your follow up appointments and call us if you have any questions or concerns.   Your colonoscopy packet will be mailed to you.

## 2016-11-18 ENCOUNTER — Other Ambulatory Visit: Payer: Self-pay

## 2016-11-26 ENCOUNTER — Ambulatory Visit: Admission: RE | Admit: 2016-11-26 | Payer: Managed Care, Other (non HMO) | Source: Ambulatory Visit

## 2016-12-03 ENCOUNTER — Ambulatory Visit: Admission: RE | Admit: 2016-12-03 | Payer: Managed Care, Other (non HMO) | Source: Ambulatory Visit

## 2016-12-10 ENCOUNTER — Ambulatory Visit: Admission: RE | Admit: 2016-12-10 | Payer: Managed Care, Other (non HMO) | Source: Ambulatory Visit

## 2016-12-30 ENCOUNTER — Other Ambulatory Visit: Payer: Self-pay

## 2016-12-30 DIAGNOSIS — Z1211 Encounter for screening for malignant neoplasm of colon: Secondary | ICD-10-CM

## 2016-12-30 MED ORDER — NA SULFATE-K SULFATE-MG SULF 17.5-3.13-1.6 GM/177ML PO SOLN: 1.0000 | Freq: Once | ORAL | 0 refills | Status: AC

## 2017-01-05 ENCOUNTER — Other Ambulatory Visit: Payer: Self-pay | Admitting: *Deleted

## 2017-01-08 ENCOUNTER — Encounter: Payer: Self-pay | Admitting: Registered Nurse

## 2017-01-08 ENCOUNTER — Ambulatory Visit
Admission: RE | Admit: 2017-01-08 | Payer: Managed Care, Other (non HMO) | Source: Ambulatory Visit | Admitting: Gastroenterology

## 2017-01-08 ENCOUNTER — Encounter: Admission: RE | Payer: Self-pay | Source: Ambulatory Visit

## 2017-01-08 SURGERY — COLONOSCOPY WITH PROPOFOL
Anesthesia: General

## 2017-01-08 MED ORDER — PROPOFOL 500 MG/50ML IV EMUL
INTRAVENOUS | Status: AC
  Filled 2017-01-08: qty 50

## 2017-01-08 MED ORDER — SODIUM CHLORIDE 0.9 % IJ SOLN
INTRAMUSCULAR | Status: AC
  Filled 2017-01-08: qty 10

## 2017-01-08 MED ORDER — PHENYLEPHRINE HCL 10 MG/ML IJ SOLN
INTRAMUSCULAR | Status: AC
  Filled 2017-01-08: qty 1

## 2017-01-08 MED ORDER — MIDAZOLAM HCL 2 MG/2ML IJ SOLN
INTRAMUSCULAR | Status: AC
  Filled 2017-01-08: qty 2

## 2017-01-08 MED ORDER — EPHEDRINE SULFATE 50 MG/ML IJ SOLN
INTRAMUSCULAR | Status: AC
  Filled 2017-01-08: qty 1

## 2017-01-12 ENCOUNTER — Ambulatory Visit: Payer: Managed Care, Other (non HMO) | Admitting: Gastroenterology

## 2017-01-23 ENCOUNTER — Other Ambulatory Visit: Payer: Self-pay | Admitting: Primary Care

## 2017-01-23 DIAGNOSIS — F411 Generalized anxiety disorder: Secondary | ICD-10-CM

## 2017-05-08 ENCOUNTER — Other Ambulatory Visit: Payer: Self-pay | Admitting: Primary Care

## 2017-05-08 DIAGNOSIS — F411 Generalized anxiety disorder: Secondary | ICD-10-CM

## 2017-05-08 NOTE — Telephone Encounter (Signed)
Last refill 01/23/17 #30 +2RF,  Last OV 10/15/16 Ok to refill?

## 2017-05-10 NOTE — Telephone Encounter (Signed)
Sent refills to pharmacy

## 2017-09-10 ENCOUNTER — Emergency Department: Payer: Managed Care, Other (non HMO)

## 2017-09-10 ENCOUNTER — Encounter: Payer: Self-pay | Admitting: Emergency Medicine

## 2017-09-10 ENCOUNTER — Observation Stay
Admission: EM | Admit: 2017-09-10 | Discharge: 2017-09-12 | Disposition: A | Discharge status: Home / Self Care | Payer: Managed Care, Other (non HMO) | Attending: Internal Medicine | Admitting: Internal Medicine

## 2017-09-10 DIAGNOSIS — Z888 Allergy status to other drugs, medicaments and biological substances status: Secondary | ICD-10-CM | POA: Insufficient documentation

## 2017-09-10 DIAGNOSIS — K529 Noninfective gastroenteritis and colitis, unspecified: Secondary | ICD-10-CM | POA: Diagnosis not present

## 2017-09-10 DIAGNOSIS — Z79899 Other long term (current) drug therapy: Secondary | ICD-10-CM | POA: Diagnosis not present

## 2017-09-10 DIAGNOSIS — F419 Anxiety disorder, unspecified: Secondary | ICD-10-CM | POA: Diagnosis not present

## 2017-09-10 DIAGNOSIS — M109 Gout, unspecified: Secondary | ICD-10-CM | POA: Diagnosis not present

## 2017-09-10 DIAGNOSIS — R112 Nausea with vomiting, unspecified: Secondary | ICD-10-CM

## 2017-09-10 LAB — URINALYSIS, COMPLETE (UACMP) WITH MICROSCOPIC
Bacteria, UA: NONE SEEN
Bilirubin Urine: NEGATIVE
GLUCOSE, UA: NEGATIVE mg/dL
HGB URINE DIPSTICK: NEGATIVE
Ketones, ur: NEGATIVE mg/dL
LEUKOCYTES UA: NEGATIVE
NITRITE: NEGATIVE
Protein, ur: NEGATIVE mg/dL
SPECIFIC GRAVITY, URINE: 1.019 (ref 1.005–1.030)
Squamous Epithelial / LPF: NONE SEEN
pH: 5 (ref 5.0–8.0)

## 2017-09-10 LAB — COMPREHENSIVE METABOLIC PANEL
ALK PHOS: 49 U/L (ref 38–126)
ALT: 19 U/L (ref 17–63)
AST: 23 U/L (ref 15–41)
Albumin: 4.5 g/dL (ref 3.5–5.0)
Anion gap: 11 (ref 5–15)
BUN: 9 mg/dL (ref 6–20)
CALCIUM: 9.7 mg/dL (ref 8.9–10.3)
CO2: 25 mmol/L (ref 22–32)
CREATININE: 1.27 mg/dL — AB (ref 0.61–1.24)
Chloride: 99 mmol/L — ABNORMAL LOW (ref 101–111)
Glucose, Bld: 92 mg/dL (ref 65–99)
Potassium: 4.1 mmol/L (ref 3.5–5.1)
Sodium: 135 mmol/L (ref 135–145)
Total Bilirubin: 0.9 mg/dL (ref 0.3–1.2)
Total Protein: 7.7 g/dL (ref 6.5–8.1)

## 2017-09-10 LAB — CBC
HCT: 43.4 % (ref 40.0–52.0)
Hemoglobin: 14.7 g/dL (ref 13.0–18.0)
MCH: 31.1 pg (ref 26.0–34.0)
MCHC: 33.8 g/dL (ref 32.0–36.0)
MCV: 91.9 fL (ref 80.0–100.0)
PLATELETS: 329 10*3/uL (ref 150–440)
RBC: 4.72 MIL/uL (ref 4.40–5.90)
RDW: 13.1 % (ref 11.5–14.5)
WBC: 12.1 10*3/uL — ABNORMAL HIGH (ref 3.8–10.6)

## 2017-09-10 LAB — TROPONIN I: Troponin I: <0.03 ng/mL (ref <0.03)

## 2017-09-10 LAB — LIPASE, BLOOD: Lipase: 28 U/L (ref 11–51)

## 2017-09-10 MED ORDER — METOCLOPRAMIDE HCL 5 MG/ML IJ SOLN
10.0000 mg | Freq: Once | INTRAMUSCULAR | Status: AC
  Administered 2017-09-10: 10 mg via INTRAVENOUS

## 2017-09-10 MED ORDER — SODIUM CHLORIDE 0.9 % IV SOLN
3.0000 g | Freq: Once | INTRAVENOUS | Status: AC
  Administered 2017-09-11: 3 g via INTRAVENOUS
  Filled 2017-09-10: qty 3

## 2017-09-10 MED ORDER — ACETAMINOPHEN 500 MG PO TABS
ORAL_TABLET | ORAL | Status: AC
  Administered 2017-09-10: 1000 mg via ORAL
  Filled 2017-09-10: qty 2

## 2017-09-10 MED ORDER — SODIUM CHLORIDE 0.9 % IV BOLUS (SEPSIS)
1000.0000 mL | Freq: Once | INTRAVENOUS | Status: AC
  Administered 2017-09-10: 1000 mL via INTRAVENOUS

## 2017-09-10 MED ORDER — METRONIDAZOLE IN NACL 5-0.79 MG/ML-% IV SOLN
500.0000 mg | Freq: Once | INTRAVENOUS | Status: AC
  Administered 2017-09-11: 500 mg via INTRAVENOUS
  Filled 2017-09-10: qty 100

## 2017-09-10 MED ORDER — IOPAMIDOL (ISOVUE-370) INJECTION 76%
100.0000 mL | Freq: Once | INTRAVENOUS | Status: AC | PRN
Start: 2017-09-10 — End: 2017-09-10
  Administered 2017-09-10: 100 mL via INTRAVENOUS
  Filled 2017-09-10: qty 100

## 2017-09-10 MED ORDER — METOCLOPRAMIDE HCL 5 MG/ML IJ SOLN
INTRAMUSCULAR | Status: AC
  Administered 2017-09-10: 10 mg via INTRAVENOUS
  Filled 2017-09-10: qty 2

## 2017-09-10 MED ORDER — ONDANSETRON HCL 4 MG/2ML IJ SOLN
INTRAMUSCULAR | Status: AC
  Administered 2017-09-10: 4 mg via INTRAVENOUS
  Filled 2017-09-10: qty 2

## 2017-09-10 MED ORDER — ONDANSETRON HCL 4 MG/2ML IJ SOLN
4.0000 mg | Freq: Once | INTRAMUSCULAR | Status: AC
  Administered 2017-09-10: 4 mg via INTRAVENOUS

## 2017-09-10 MED ORDER — ACETAMINOPHEN 500 MG PO TABS
1000.0000 mg | ORAL_TABLET | Freq: Once | ORAL | Status: AC
  Administered 2017-09-10: 1000 mg via ORAL
  Filled 2017-09-10: qty 2

## 2017-09-10 MED ORDER — FAMOTIDINE IN NACL 20-0.9 MG/50ML-% IV SOLN
20.0000 mg | Freq: Once | INTRAVENOUS | Status: AC
  Administered 2017-09-10: 20 mg via INTRAVENOUS
  Filled 2017-09-10: qty 50

## 2017-09-10 MED ORDER — GI COCKTAIL ~~LOC~~
ORAL | Status: AC
  Administered 2017-09-10: 30 mL via ORAL
  Filled 2017-09-10: qty 30

## 2017-09-10 MED ORDER — FAMOTIDINE IN NACL 20-0.9 MG/50ML-% IV SOLN
INTRAVENOUS | Status: AC
  Administered 2017-09-10: 20 mg via INTRAVENOUS
  Filled 2017-09-10: qty 50

## 2017-09-10 MED ORDER — GI COCKTAIL ~~LOC~~
30.0000 mL | Freq: Once | ORAL | Status: AC
  Administered 2017-09-10: 30 mL via ORAL

## 2017-09-10 NOTE — ED Notes (Signed)
Patient transported to XR. 

## 2017-09-10 NOTE — ED Notes (Signed)
Patient transported to CT 

## 2017-09-10 NOTE — ED Notes (Addendum)
Pt c/o mid-abdominal pain that shoots up to his chest that began upon waking today. Also had nausea and continues to have nausea at this time. Pt denies any shortness of breath. Pt family at bedside and state that he "has lost his color." Pt also c/o severe left ear pain that he started taking cipro for via his company doctor.

## 2017-09-10 NOTE — ED Provider Notes (Signed)
[facility] Emergency Department Provider Note  ____________________________________________  Time seen: Approximately 8:31 PM  I have reviewed the triage vital signs and the nursing notes.   HISTORY  Chief Complaint Abdominal Pain   HPI Darrell Lawson is a 46 y.o. male with history of anxiety, diverticulitis, and headaches who presents for evaluation of abdominal pain. Patient reports that he's been taking 1 BC powder every night for the last 2 weeks due to left-sided ear ache. This morning he woke up feeling nauseous and complaining of epigastric abdominal pain. After dinner this evening had 1 episode of nonbloody nonbilious emesis. Patient is describing a sharp but also dull moderate pain located in the epigastric region, constant and nonradiating. He denies ever having similar pain. He denies coffee-ground emesis, hematemesis, melena. Had a normal BM today. No prior history of ulcers or GI bleed. No personal or family history of cardiac disease. No shortness of breath, no cough or congestion, no dysuria or hematuria. He is complaining of chills but no fever.Does not drink alcohol.  Past Medical History:  Diagnosis Date  . Anxiety   . Diverticulitis large intestine   . Frequent headaches   . Gout     Patient Active Problem List   Diagnosis Date Noted  . Acute colitis 09/11/2017  . Colitis   . Encounter for annual general medical examination with abnormal findings in adult 10/01/2016  . GAD (generalized anxiety disorder) 08/20/2016  . Chronic maxillary sinusitis 01/23/2016  . Diverticulitis of colon 10/12/2013    Past Surgical History:  Procedure Laterality Date  . EYE SURGERY Left    age 67    Prior to Admission medications   Medication Sig Start Date End Date Taking? Authorizing Provider  CIPRO HC OTIC suspension Place 3 drops in ear(s) 2 (two) times daily. 09/02/17  Yes [provider]  ciprofloxacin (CIPRO) 500 MG tablet Take 1  tablet (500 mg total) by mouth 2 (two) times daily. 09/12/17   Shaune Pollack, MD  escitalopram (LEXAPRO) 10 MG tablet TAKE 1 TABLET (10 MG TOTAL) BY MOUTH DAILY. 05/10/17   Doreene Nest, NP  metroNIDAZOLE (FLAGYL) 500 MG tablet Take 1 tablet (500 mg total) by mouth every 8 (eight) hours. 09/12/17   Shaune Pollack, MD  pantoprazole (PROTONIX) 40 MG tablet Take 1 tablet (40 mg total) by mouth daily. 09/12/17   Shaune Pollack, MD    Allergies Morphine and related  Family History  Problem Relation Age of Onset  . Hypertension Mother   . Hypertension Father   . Heart disease Father   . Diabetes Father   . Cancer Maternal Grandmother        breast    Social History Social History  Substance Use Topics  . Smoking status: Never Smoker  . Smokeless tobacco: Never Used  . Alcohol use No    Review of Systems  Constitutional: Negative for fever. Eyes: Negative for visual changes. ENT: Negative for sore throat. + L ear ache Neck: No neck pain  Cardiovascular: Negative for chest pain. Respiratory: Negative for shortness of breath. Gastrointestinal: + epigastric abdominal pain, nausea, and vomiting. No diarrhea. Genitourinary: Negative for dysuria. Musculoskeletal: Negative for back pain. Skin: Negative for rash. Neurological: Negative for headaches, weakness or numbness. Psych: No SI or HI  ____________________________________________   PHYSICAL EXAM:  VITAL SIGNS: ED Triage Vitals  Enc Vitals Group     BP 09/10/17 1954 (!) 154/89     Pulse Rate 09/10/17 1953 87  Resp 09/10/17 1953 18     Temp 09/10/17 1953 98.2 F (36.8 C)     Temp Source 09/10/17 1953 Oral     SpO2 09/10/17 1953 100 %     Weight 09/10/17 1953 228 lb (103.4 kg)     Height 09/10/17 1953  (1.854 m)     Head Circumference --      Peak Flow --      Pain Score 09/10/17 1953 10     Pain Loc --      Pain Edu? --      Excl. in GC? --     Constitutional: Alert and oriented. Well appearing and in no  apparent distress. HEENT:      Head: Normocephalic and atraumatic.         Eyes: Conjunctivae are normal. Sclera is non-icteric.       Ears: TM visualized and clear bilaterally with no evidence of infection, external canals within normal limits with no erythema.      Mouth/Throat: Mucous membranes are moist. Patient has a L lower molar cavity. Oropharynx is clear      Neck: Supple with no signs of meningismus. Cardiovascular: Regular rate and rhythm. No murmurs, gallops, or rubs. 2+ symmetrical distal pulses are present in all extremities. No JVD. Respiratory: Normal respiratory effort. Lungs are clear to auscultation bilaterally. No wheezes, crackles, or rhonchi.  Gastrointestinal: Soft, ttp over the epigastric region, and non distended with positive bowel sounds. No rebound or guarding. Genitourinary: No CVA tenderness. Musculoskeletal: Nontender with normal range of motion in all extremities. No edema, cyanosis, or erythema of extremities. Neurologic: Normal speech and language. Face is symmetric. Moving all extremities. No gross focal neurologic deficits are appreciated. Skin: Skin is warm, dry and intact. No rash noted. Psychiatric: Mood and affect are normal. Speech and behavior are normal.  ____________________________________________   LABS (all labs ordered are listed, but only abnormal results are displayed)  Labs Reviewed  COMPREHENSIVE METABOLIC PANEL - Abnormal; Notable for the following:       Result Value   Chloride 99 (*)    Creatinine, Ser 1.27 (*)    All other components within normal limits  CBC - Abnormal; Notable for the following:    WBC 12.1 (*)    All other components within normal limits  URINALYSIS, COMPLETE (UACMP) WITH MICROSCOPIC - Abnormal; Notable for the following:    Color, Urine YELLOW (*)    APPearance CLEAR (*)    All other components within normal limits  BASIC METABOLIC PANEL - Abnormal; Notable for the following:    Glucose, Bld 117 (*)     Calcium 8.8 (*)    All other components within normal limits  LIPASE, BLOOD  TROPONIN I  TROPONIN I  HIV ANTIBODY (ROUTINE TESTING)  CBC   ____________________________________________  EKG  ED ECG REPORT I, Nita Sickle, the attending physician, personally viewed and interpreted this ECG.  Normal sinus rhythm, rate of 84, normal intervals, normal axis, no ST elevations or depressions. Normal EKG  ____________________________________________  RADIOLOGY  CXR: No active cardiopulmonary disease.  CT a/p: 1. Mild wall thickening along the sigmoid colon, concerning for an acute infectious or inflammatory process. 2. Mild distention of small-bowel loops to 3.1 cm in diameter, with mild surrounding soft tissue inflammation and fecalization of the distal ileum. This raises concern for mild ileitis and dysmotility. No evidence of bowel obstruction. ____________________________________________   PROCEDURES  Procedure(s) performed: None Procedures Critical Care performed:  None ____________________________________________  INITIAL IMPRESSION / ASSESSMENT AND PLAN / ED COURSE  46 y.o. male with history of anxiety, diverticulitis, and headaches who presents for evaluation of epigastric abdominal pain, nausea, and vomiting since earlier today in the setting of taking BC powder daily for 2 weeks for earache. Patient is ttp over the epigastric region with no rebound or guarding. Ddx PUD vs gastritis vs pancreatitis vs GB pathology. Heart score of 0, EKG normal. Will get one troponin. Will give GI cocktail, zofran, pepcid, IVF and reassess. Labs pending.   ED COURSE:  Labs with no acute findings. Normal CBC, CMP, lipase, and troponin x 2. CT concerning for colitis. Patient started on Unasyn and flagyl. Patient continued to have significant nausea and vomiting after 3 rounds of anti-emetics and therefore will be admitted for further management,   As part of my medical decision  making, I reviewed the following data within the electronic MEDICAL RECORD NUMBER History obtained from family, Nursing notes reviewed and incorporated, EKG interpreted , Old chart reviewed, Discussed with admitting physician , Notes from prior ED visits and [place] Controlled Substance Database    Pertinent labs & imaging results that were available during my care of the patient were reviewed by me and considered in my medical decision making (see chart for details).    ____________________________________________   FINAL CLINICAL IMPRESSION(S) / ED DIAGNOSES  Final diagnoses:  Colitis  Intractable vomiting with nausea, unspecified vomiting type      NEW MEDICATIONS STARTED DURING THIS VISIT:  Discharge Medication List as of 09/12/2017 11:07 AM    START taking these medications   Details  ciprofloxacin (CIPRO) 500 MG tablet Take 1 tablet (500 mg total) by mouth 2 (two) times daily., Starting Sat 09/12/2017, Print    metroNIDAZOLE (FLAGYL) 500 MG tablet Take 1 tablet (500 mg total) by mouth every 8 (eight) hours., Starting Sat 09/12/2017, Print    pantoprazole (PROTONIX) 40 MG tablet Take 1 tablet (40 mg total) by mouth daily., Starting Sat 09/12/2017, Print         Note:  This document was prepared using Dragon voice recognition software and may include unintentional dictation errors.    Nita Sickle, MD 09/15/17 [phone]

## 2017-09-10 NOTE — ED Triage Notes (Signed)
Pt c/o mid abdominal pain that started today with nausea. Has had vomiting.  No diarrhea or fevers.  Color WNL. VSS. NAD.  Ambulatory to triage.  No urinary sx.

## 2017-09-11 ENCOUNTER — Encounter: Payer: Self-pay | Admitting: Internal Medicine

## 2017-09-11 DIAGNOSIS — K529 Noninfective gastroenteritis and colitis, unspecified: Secondary | ICD-10-CM | POA: Diagnosis present

## 2017-09-11 DIAGNOSIS — R1084 Generalized abdominal pain: Secondary | ICD-10-CM | POA: Diagnosis not present

## 2017-09-11 DIAGNOSIS — R933 Abnormal findings on diagnostic imaging of other parts of digestive tract: Secondary | ICD-10-CM | POA: Diagnosis not present

## 2017-09-11 LAB — CBC
HCT: 40 % (ref 40.0–52.0)
Hemoglobin: 13.9 g/dL (ref 13.0–18.0)
MCH: 31.6 pg (ref 26.0–34.0)
MCHC: 34.8 g/dL (ref 32.0–36.0)
MCV: 91 fL (ref 80.0–100.0)
PLATELETS: 312 10*3/uL (ref 150–440)
RBC: 4.4 MIL/uL (ref 4.40–5.90)
RDW: 13.1 % (ref 11.5–14.5)
WBC: 8.7 10*3/uL (ref 3.8–10.6)

## 2017-09-11 LAB — BASIC METABOLIC PANEL
Anion gap: 5 (ref 5–15)
BUN: 7 mg/dL (ref 6–20)
CALCIUM: 8.8 mg/dL — AB (ref 8.9–10.3)
CHLORIDE: 107 mmol/L (ref 101–111)
CO2: 27 mmol/L (ref 22–32)
CREATININE: 1.08 mg/dL (ref 0.61–1.24)
GFR calc Af Amer: >60 mL/min (ref >60)
GFR calc non Af Amer: >60 mL/min (ref >60)
GLUCOSE: 117 mg/dL — AB (ref 65–99)
Potassium: 4 mmol/L (ref 3.5–5.1)
Sodium: 139 mmol/L (ref 135–145)

## 2017-09-11 MED ORDER — ESCITALOPRAM OXALATE 10 MG PO TABS
10.0000 mg | ORAL_TABLET | Freq: Every day | ORAL | Status: DC
  Administered 2017-09-11 – 2017-09-12 (×2): 10 mg via ORAL
  Filled 2017-09-11 (×2): qty 1

## 2017-09-11 MED ORDER — OXYCODONE-ACETAMINOPHEN 5-325 MG PO TABS
1.0000 | ORAL_TABLET | Freq: Four times a day (QID) | ORAL | Status: DC | PRN
  Administered 2017-09-11 (×3): 1 via ORAL
  Filled 2017-09-11 (×3): qty 1

## 2017-09-11 MED ORDER — ONDANSETRON HCL 4 MG PO TABS: 4.0000 mg | ORAL_TABLET | Freq: Four times a day (QID) | ORAL | Status: DC | PRN

## 2017-09-11 MED ORDER — ACETAMINOPHEN 650 MG RE SUPP: 650.0000 mg | Freq: Four times a day (QID) | RECTAL | Status: DC | PRN

## 2017-09-11 MED ORDER — METRONIDAZOLE IN NACL 5-0.79 MG/ML-% IV SOLN
INTRAVENOUS | Status: AC
  Filled 2017-09-11: qty 100

## 2017-09-11 MED ORDER — PANTOPRAZOLE SODIUM 40 MG PO TBEC
40.0000 mg | DELAYED_RELEASE_TABLET | Freq: Two times a day (BID) | ORAL | Status: DC
  Administered 2017-09-11 – 2017-09-12 (×2): 40 mg via ORAL
  Filled 2017-09-11 (×2): qty 1

## 2017-09-11 MED ORDER — MORPHINE SULFATE (PF) 2 MG/ML IV SOLN: 2.0000 mg | INTRAVENOUS | Status: DC | PRN

## 2017-09-11 MED ORDER — PROMETHAZINE HCL 25 MG/ML IJ SOLN
12.5000 mg | Freq: Four times a day (QID) | INTRAMUSCULAR | Status: DC | PRN
  Administered 2017-09-11 (×3): 12.5 mg via INTRAVENOUS
  Filled 2017-09-11 (×3): qty 1

## 2017-09-11 MED ORDER — METRONIDAZOLE IN NACL 5-0.79 MG/ML-% IV SOLN
500.0000 mg | Freq: Three times a day (TID) | INTRAVENOUS | Status: DC
  Administered 2017-09-11: 500 mg via INTRAVENOUS
  Filled 2017-09-11 (×4): qty 100

## 2017-09-11 MED ORDER — SODIUM CHLORIDE 0.9 % IV SOLN
INTRAVENOUS | Status: DC
  Administered 2017-09-11: 05:00:00 via INTRAVENOUS

## 2017-09-11 MED ORDER — ALUM & MAG HYDROXIDE-SIMETH 200-200-20 MG/5ML PO SUSP: 30.0000 mL | Freq: Four times a day (QID) | ORAL | Status: DC | PRN

## 2017-09-11 MED ORDER — KETOROLAC TROMETHAMINE 15 MG/ML IJ SOLN
15.0000 mg | Freq: Four times a day (QID) | INTRAMUSCULAR | Status: DC | PRN
  Administered 2017-09-11: 15 mg via INTRAVENOUS
  Filled 2017-09-11: qty 1

## 2017-09-11 MED ORDER — CIPROFLOXACIN HCL 500 MG PO TABS
500.0000 mg | ORAL_TABLET | Freq: Two times a day (BID) | ORAL | Status: DC
  Administered 2017-09-11 – 2017-09-12 (×2): 500 mg via ORAL
  Filled 2017-09-11 (×3): qty 1

## 2017-09-11 MED ORDER — ACETAMINOPHEN 325 MG PO TABS
650.0000 mg | ORAL_TABLET | Freq: Four times a day (QID) | ORAL | Status: DC | PRN
  Administered 2017-09-11: 650 mg via ORAL
  Filled 2017-09-11: qty 2

## 2017-09-11 MED ORDER — METRONIDAZOLE 500 MG PO TABS
500.0000 mg | ORAL_TABLET | Freq: Three times a day (TID) | ORAL | Status: DC
  Administered 2017-09-11 – 2017-09-12 (×3): 500 mg via ORAL
  Filled 2017-09-11 (×6): qty 1

## 2017-09-11 MED ORDER — ONDANSETRON HCL 4 MG/2ML IJ SOLN: 4.0000 mg | Freq: Four times a day (QID) | INTRAMUSCULAR | Status: DC | PRN

## 2017-09-11 MED ORDER — LEVOFLOXACIN IN D5W 500 MG/100ML IV SOLN
500.0000 mg | INTRAVENOUS | Status: DC
  Administered 2017-09-11: 500 mg via INTRAVENOUS
  Filled 2017-09-11 (×2): qty 100

## 2017-09-11 NOTE — H&P (Signed)
[facility] [place] [facility]   PATIENT NAME: Darrell Lawson    MR#:  [id number]  DATE OF BIRTH:  1971-09-19  DATE OF ADMISSION:  09/10/2017  PRIMARY CARE PHYSICIAN: Doreene Nest, NP   REQUESTING/REFERRING PHYSICIAN:   CHIEF COMPLAINT:   Chief Complaint  Patient presents with  . Abdominal Pain    HISTORY OF PRESENT ILLNESS: Darrell Lawson  is a 46 y.o. male with a known history of Anxiety disorder, diverticular disease, gout presented to the emergency room with abdominal pain since yesterday morning. Pain started around 6 AM yesterday morning. Pain is located around the umbilicus, aching in nature 6 out of 10 on a scale of 1-10. Patient also complains of nausea and had an episode of vomiting. No recent travel, any sick contacts at home. Patient was evaluated with CT abdomen in the emergency room which showed colitis and inflammation of small bowel but no evidence of obstruction. His last bowel movement was yesterday evening. No complaints of any fever and chills. No complaints of any chest pain, shortness of breath.  PAST MEDICAL HISTORY:   Past Medical History:  Diagnosis Date  . Anxiety   . Diverticulitis large intestine   . Frequent headaches   . Gout     PAST SURGICAL HISTORY: Past Surgical History:  Procedure Laterality Date  . EYE SURGERY Left    age 68    SOCIAL HISTORY:  Social History  Substance Use Topics  . Smoking status: Never Smoker  . Smokeless tobacco: Never Used  . Alcohol use No    FAMILY HISTORY:  Family History  Problem Relation Age of Onset  . Hypertension Mother   . Hypertension Father   . Heart disease Father   . Diabetes Father   . Cancer Maternal Grandmother        breast    DRUG ALLERGIES: No Known Allergies  REVIEW OF SYSTEMS:   CONSTITUTIONAL: No fever, fatigue or weakness.  EYES: No blurred or double vision.  EARS, NOSE, AND THROAT: No tinnitus or ear pain.  RESPIRATORY: No cough, shortness of  breath, wheezing or hemoptysis.  CARDIOVASCULAR: No chest pain, orthopnea, edema.  GASTROINTESTINAL: Has nausea, vomiting,  abdominal pain.  No diarrhea. GENITOURINARY: No dysuria, hematuria.  ENDOCRINE: No polyuria, nocturia,  HEMATOLOGY: No anemia, easy bruising or bleeding SKIN: No rash or lesion. MUSCULOSKELETAL: No joint pain or arthritis.   NEUROLOGIC: No tingling, numbness, weakness.  PSYCHIATRY: No anxiety or depression.   MEDICATIONS AT HOME:  Prior to Admission medications   Medication Sig Start Date End Date Taking? Authorizing Provider  CIPRO HC OTIC suspension Place 3 drops in ear(s) 2 (two) times daily. 09/02/17  Yes [provider]  escitalopram (LEXAPRO) 10 MG tablet TAKE 1 TABLET (10 MG TOTAL) BY MOUTH DAILY. 05/10/17   Doreene Nest, NP      PHYSICAL EXAMINATION:   VITAL SIGNS: Blood pressure 122/79, pulse 83, temperature 98.2 F (36.8 C), temperature source Oral, resp. rate 18, height  (1.854 m), weight 103.4 kg (228 lb), SpO2 97 %.  GENERAL:  46 y.o.-year-old patient lying in the bed with no acute distress.  EYES: Pupils equal, round, reactive to light and accommodation. No scleral icterus. Extraocular muscles intact.  HEENT: Head atraumatic, normocephalic. Oropharynx dry and nasopharynx clear.  NECK:  Supple, no jugular venous distention. No thyroid enlargement, no tenderness.  LUNGS: Normal breath sounds bilaterally, no wheezing, rales,rhonchi or crepitation. No use of accessory muscles of respiration.  CARDIOVASCULAR: S1, S2 normal. No murmurs, rubs, or gallops.  ABDOMEN: Soft,tenderness around umbilicus, nondistended. Bowel sounds present. No organomegaly or mass.  EXTREMITIES: No pedal edema, cyanosis, or clubbing.  NEUROLOGIC: Cranial nerves II through XII are intact. Muscle strength 5/5 in all extremities. Sensation intact. Gait not checked.  PSYCHIATRIC: The patient is alert and oriented x 3.  SKIN: No obvious rash, lesion, or ulcer.    LABORATORY PANEL:   CBC  Recent Labs Lab 09/10/17 1950  WBC 12.1*  HGB 14.7  HCT 43.4  PLT 329  MCV 91.9  MCH 31.1  MCHC 33.8  RDW 13.1   ------------------------------------------------------------------------------------------------------------------  Chemistries   Recent Labs Lab 09/10/17 1950  NA 135  K 4.1  CL 99*  CO2 25  GLUCOSE 92  BUN 9  CREATININE 1.27*  CALCIUM 9.7  AST 23  ALT 19  ALKPHOS 49  BILITOT 0.9   ------------------------------------------------------------------------------------------------------------------ estimated creatinine clearance is 91.8 mL/min (A) (by C-G formula based on SCr of 1.27 mg/dL (H)). ------------------------------------------------------------------------------------------------------------------ No results for input(s): TSH, T4TOTAL, T3FREE, THYROIDAB in the last 72 hours.  Invalid input(s): FREET3   Coagulation profile No results for input(s): INR, PROTIME in the last 168 hours. ------------------------------------------------------------------------------------------------------------------- No results for input(s): DDIMER in the last 72 hours. -------------------------------------------------------------------------------------------------------------------  Cardiac Enzymes  Recent Labs Lab 09/10/17 1950 09/10/17 2251  TROPONINI <0.03 <0.03   ------------------------------------------------------------------------------------------------------------------ Invalid input(s): POCBNP  ---------------------------------------------------------------------------------------------------------------  Urinalysis    Component Value Date/Time   COLORURINE YELLOW (A) 09/10/2017 1952   APPEARANCEUR CLEAR (A) 09/10/2017 1952   APPEARANCEUR Clear 08/28/2012 1534   LABSPEC 1.019 09/10/2017 1952   LABSPEC 1.021 08/28/2012 1534   PHURINE 5.0 09/10/2017 1952   GLUCOSEU NEGATIVE 09/10/2017 1952   GLUCOSEU  Negative 08/28/2012 1534   HGBUR NEGATIVE 09/10/2017 1952   BILIRUBINUR NEGATIVE 09/10/2017 1952   BILIRUBINUR Negative 08/28/2012 1534   KETONESUR NEGATIVE 09/10/2017 1952   PROTEINUR NEGATIVE 09/10/2017 1952   UROBILINOGEN 0.2 02/17/2008 0934   NITRITE NEGATIVE 09/10/2017 1952   LEUKOCYTESUR NEGATIVE 09/10/2017 1952   LEUKOCYTESUR Negative 08/28/2012 1534     RADIOLOGY: Dg Chest 2 View  Result Date: 09/10/2017 CLINICAL DATA:  Chest abdominal pain, onset this morning.  Nausea. EXAM: CHEST  2 VIEW COMPARISON:  03/18/2005 FINDINGS: The lungs are clear. The pulmonary vasculature is normal. Heart size is normal. Hilar and mediastinal contours are unremarkable. There is no pleural effusion. IMPRESSION: No active cardiopulmonary disease. Electronically Signed   By: Ellery Plunk M.D.   On: 09/10/2017 20:54   Ct Abdomen Pelvis W Contrast  Result Date: 09/10/2017 CLINICAL DATA:  Acute onset of mid abdominal pain, nausea and vomiting. Initial encounter. EXAM: CT ABDOMEN AND PELVIS WITH CONTRAST TECHNIQUE: Multidetector CT imaging of the abdomen and pelvis was performed using the standard protocol following bolus administration of intravenous contrast. CONTRAST:  100 mL of Isovue 370 IV contrast COMPARISON:  CT of the abdomen and pelvis from 07/27/2013 FINDINGS: Lower chest: The visualized lung bases are grossly clear. The visualized portions of the mediastinum are unremarkable. Hepatobiliary: The liver is unremarkable in appearance. The gallbladder is unremarkable in appearance. The common bile duct remains normal in caliber. Pancreas: The pancreas is within normal limits. Spleen: The spleen is unremarkable in appearance. Adrenals/Urinary Tract: The adrenal glands are unremarkable in appearance. Mild nonspecific perinephric stranding is noted bilaterally. There is no evidence of hydronephrosis. No renal or ureteral stones are identified. Stomach/Bowel: The stomach is unremarkable in appearance.  There is mild distention of small-bowel loops  to 3.1 cm in diameter, with fecalization of the distal ileum, and mild surrounding soft tissue inflammation. This raises concern for ileitis and dysmotility. The appendix is normal in caliber, without evidence of appendicitis. Scattered diverticulosis is noted along the entirety of the colon. Mild wall thickening is noted along the sigmoid colon, concerning for an acute infectious or inflammatory process. Trace free fluid is noted in the pelvis. Vascular/Lymphatic: The abdominal aorta is unremarkable in appearance. The inferior vena cava is grossly unremarkable. No retroperitoneal lymphadenopathy is seen. No pelvic sidewall lymphadenopathy is identified. Reproductive: The bladder is mildly distended and grossly unremarkable. The prostate remains normal in size. Other: No additional soft tissue abnormalities are seen. Musculoskeletal: No acute osseous abnormalities are identified. The visualized musculature is unremarkable in appearance. IMPRESSION: 1. Mild wall thickening along the sigmoid colon, concerning for an acute infectious or inflammatory process. 2. Mild distention of small-bowel loops to 3.1 cm in diameter, with mild surrounding soft tissue inflammation and fecalization of the distal ileum. This raises concern for mild ileitis and dysmotility. No evidence of bowel obstruction. Electronically Signed   By: Roanna Raider M.D.   On: 09/10/2017 23:05    EKG: Orders placed or performed during the hospital encounter of 09/10/17  . ED EKG  . ED EKG    IMPRESSION AND PLAN: 46 year old male patient with history of anxiety disorder, gout, diverticular disease presented to the emergency with abdominal pain.  Admitting diagnosis 1. Acute colitis 2. Abdominal pain 3. Leukocytosis 4. Nausea and vomiting Treatment plan Admit patient to medical for observation bed IV fluid hydration Clear liquid diet Start patient on IV Flagyl and IV Levaquin  antibiotic Follow-up WBC count Pain management with IV morphine as needed  All the records are reviewed and case discussed with ED provider. Management plans discussed with the patient, family and they are in agreement.  CODE STATUS:FULL CODE Code Status History    This patient does not have a recorded code status. Please follow your organizational policy for patients in this situation.       TOTAL TIME TAKING CARE OF THIS PATIENT: 50 minutes.    Ihor Austin M.D on 09/11/2017 at 2:44 AM  Between 7am to 6pm - Pager - [phone]  After 6pm go to [email or web address] - password [email or web address] [facility]  [facility]  Office  [phone]  CC: Primary care physician; Doreene Nest, NP

## 2017-09-11 NOTE — Consult Note (Signed)
Midge Minium, MD [facility]  [address] [place], [place] [address] Phone: [phone] Fax : [phone]  Consultation  Referring Provider:     Dr. Imogene Burn Primary Care Physician:  Doreene Nest, NP Primary Gastroenterologist:          Dr. Tobi Bastos Reason for Consultation:     Abdominal pain  Date of Admission:  09/10/2017 Date of Consultation:  09/11/2017         HPI:   Darrell Lawson is a 46 y.o. male Who has a history of rectal bleeding and diverticulitis and was seen last October by Dr. Tobi Bastos. The patient was now admitted after not feeling well yesterday before he went to work and then by supper time he felt worse.  The patient ate some chicken noodle soup and states he started vomiting.  The pain was located around the umbilicus.  He reports that he had been given antibiotics in the ER and now states that his symptoms are much better.  The patient's white cell count was also elevated slightly above 12 and now has come back to normal.  He denies any sick contacts at home or at work.  The patient also denies any diarrhea or black stools or bloody stools before this started but has been having loose stool since admission and since starting antibiotics.  The patient had a CT scan of the abdomen which showed Mild wall thickening along the sigmoid colon concerning for an infectious process and mild ileitis.  Past Medical History:  Diagnosis Date  . Anxiety   . Diverticulitis large intestine   . Frequent headaches   . Gout     Past Surgical History:  Procedure Laterality Date  . EYE SURGERY Left    age 63    Prior to Admission medications   Medication Sig Start Date End Date Taking? Authorizing Provider  CIPRO HC OTIC suspension Place 3 drops in ear(s) 2 (two) times daily. 09/02/17  Yes [provider]  escitalopram (LEXAPRO) 10 MG tablet TAKE 1 TABLET (10 MG TOTAL) BY MOUTH DAILY. 05/10/17   Doreene Nest, NP    Family History  Problem Relation Age of Onset    . Hypertension Mother   . Hypertension Father   . Heart disease Father   . Diabetes Father   . Cancer Maternal Grandmother        breast     Social History  Substance Use Topics  . Smoking status: Never Smoker  . Smokeless tobacco: Never Used  . Alcohol use No    Allergies as of 09/10/2017  . (No Known Allergies)    Review of Systems:    All systems reviewed and negative except where noted in HPI.   Physical Exam:  Vital signs in last 24 hours: Temp:  [97.5 F (36.4 C)-98.2 F (36.8 C)] 97.5 F (36.4 C) (10/12 1215) Pulse Rate:  [69-87] 69 (10/12 1215) Resp:  [14-18] 14 (10/12 1215) BP: (118-154)/(72-89) 118/75 (10/12 1215) SpO2:  [96 %-100 %] 98 % (10/12 1215) Weight:  [228 lb (103.4 kg)] 228 lb (103.4 kg) (10/11 1953) Last BM Date: 09/10/17 General:   Pleasant, cooperative in NAD Head:  Normocephalic and atraumatic. Eyes:   No icterus.   Conjunctiva pink. PERRLA. Ears:  Normal auditory acuity. Neck:  Supple; no masses or thyroidomegaly Lungs: Respirations even and unlabored. Lungs clear to auscultation bilaterally.   No wheezes, crackles, or rhonchi.  Heart:  Regular rate and rhythm;  Without murmur, clicks,  rubs or gallops Abdomen:  Soft, nondistended, nontender. Normal bowel sounds. No appreciable masses or hepatomegaly.  No rebound or guarding.  Rectal:  Not performed. Msk:  Symmetrical without gross deformities.    Extremities:  Without edema, cyanosis or clubbing. Neurologic:  Alert and oriented x3;  grossly normal neurologically. Skin:  Intact without significant lesions or rashes. Cervical Nodes:  No significant cervical adenopathy. Psych:  Alert and cooperative. Normal affect.  LAB RESULTS:  Recent Labs  09/10/17 1950 09/11/17 0415  WBC 12.1* 8.7  HGB 14.7 13.9  HCT 43.4 40.0  PLT 329 312   BMET  Recent Labs  09/10/17 1950 09/11/17 0415  NA 135 139  K 4.1 4.0  CL 99* 107  CO2 25 27  GLUCOSE 92 117*  BUN 9 7  CREATININE 1.27* 1.08   CALCIUM 9.7 8.8*   LFT  Recent Labs  09/10/17 1950  PROT 7.7  ALBUMIN 4.5  AST 23  ALT 19  ALKPHOS 49  BILITOT 0.9   PT/INR No results for input(s): LABPROT, INR in the last 72 hours.  STUDIES: Dg Chest 2 View  Result Date: 09/10/2017 CLINICAL DATA:  Chest abdominal pain, onset this morning.  Nausea. EXAM: CHEST  2 VIEW COMPARISON:  03/18/2005 FINDINGS: The lungs are clear. The pulmonary vasculature is normal. Heart size is normal. Hilar and mediastinal contours are unremarkable. There is no pleural effusion. IMPRESSION: No active cardiopulmonary disease. Electronically Signed   By: Ellery Plunk M.D.   On: 09/10/2017 20:54   Ct Abdomen Pelvis W Contrast  Result Date: 09/10/2017 CLINICAL DATA:  Acute onset of mid abdominal pain, nausea and vomiting. Initial encounter. EXAM: CT ABDOMEN AND PELVIS WITH CONTRAST TECHNIQUE: Multidetector CT imaging of the abdomen and pelvis was performed using the standard protocol following bolus administration of intravenous contrast. CONTRAST:  100 mL of Isovue 370 IV contrast COMPARISON:  CT of the abdomen and pelvis from 07/27/2013 FINDINGS: Lower chest: The visualized lung bases are grossly clear. The visualized portions of the mediastinum are unremarkable. Hepatobiliary: The liver is unremarkable in appearance. The gallbladder is unremarkable in appearance. The common bile duct remains normal in caliber. Pancreas: The pancreas is within normal limits. Spleen: The spleen is unremarkable in appearance. Adrenals/Urinary Tract: The adrenal glands are unremarkable in appearance. Mild nonspecific perinephric stranding is noted bilaterally. There is no evidence of hydronephrosis. No renal or ureteral stones are identified. Stomach/Bowel: The stomach is unremarkable in appearance. There is mild distention of small-bowel loops to 3.1 cm in diameter, with fecalization of the distal ileum, and mild surrounding soft tissue inflammation. This raises concern for  ileitis and dysmotility. The appendix is normal in caliber, without evidence of appendicitis. Scattered diverticulosis is noted along the entirety of the colon. Mild wall thickening is noted along the sigmoid colon, concerning for an acute infectious or inflammatory process. Trace free fluid is noted in the pelvis. Vascular/Lymphatic: The abdominal aorta is unremarkable in appearance. The inferior vena cava is grossly unremarkable. No retroperitoneal lymphadenopathy is seen. No pelvic sidewall lymphadenopathy is identified. Reproductive: The bladder is mildly distended and grossly unremarkable. The prostate remains normal in size. Other: No additional soft tissue abnormalities are seen. Musculoskeletal: No acute osseous abnormalities are identified. The visualized musculature is unremarkable in appearance. IMPRESSION: 1. Mild wall thickening along the sigmoid colon, concerning for an acute infectious or inflammatory process. 2. Mild distention of small-bowel loops to 3.1 cm in diameter, with mild surrounding soft tissue inflammation and fecalization of the distal ileum.  This raises concern for mild ileitis and dysmotility. No evidence of bowel obstruction. Electronically Signed   By: Roanna Raider M.D.   On: 09/10/2017 23:05      Impression / Plan:   Darrell Lawson is a 46 y.o. y/o male with a history of diverticulosis and diverticulitis who was admitted with abdominal pain and thickening in the sigmoid colon and distal small bowel consistent with an acute infectious process.  The patient is doing much better since he was started on antibiotics.  He is now tolerating food by mouth.  I recommend continuing the patient antibiotics on an outpatient basis and have him follow-up with Dr. Tobi Bastos at the next available appointment.   Thank you for involving me in the care of this patient.      LOS: 0 days   Midge Minium, MD  09/11/2017, 6:44 PM   Note: This dictation was prepared with Dragon dictation along  with smaller phrase technology. Any transcriptional errors that result from this process are unintentional.

## 2017-09-11 NOTE — Progress Notes (Addendum)
The patient complains of epigastric area pain, which is intermittent, 7 out of 10 without radiation. He denies any nausea, vomiting or diarrhea, no melena or bloody stool. His vital signs are stable. Physical examinations is unremarkable except tenderness in epigastric area.  A/P:  1. Acute colitis. Continue  Flagyl and cipro. IV fluid support. 2. Abdominal epigastric pain, start Protonix, GI consult. Pain control. 3. Leukocytosis. Improved.  Time spent 22 minutes.

## 2017-09-12 LAB — HIV ANTIBODY (ROUTINE TESTING W REFLEX): HIV Screen 4th Generation wRfx: NONREACTIVE

## 2017-09-12 MED ORDER — CIPROFLOXACIN HCL 500 MG PO TABS: 500.0000 mg | ORAL_TABLET | Freq: Two times a day (BID) | ORAL | 0 refills | Status: DC

## 2017-09-12 MED ORDER — METRONIDAZOLE 500 MG PO TABS: 500.0000 mg | ORAL_TABLET | Freq: Three times a day (TID) | ORAL | 0 refills | Status: DC

## 2017-09-12 MED ORDER — PANTOPRAZOLE SODIUM 40 MG PO TBEC: 40.0000 mg | DELAYED_RELEASE_TABLET | Freq: Every day | ORAL | 0 refills | Status: DC

## 2017-09-12 NOTE — Discharge Summary (Signed)
[facility] [place] [facility]   PATIENT NAME: Darrell Lawson    MR#:  [id number]  DATE OF BIRTH:  1971/05/17  DATE OF ADMISSION:  09/10/2017   ADMITTING PHYSICIAN: Ihor Austin, MD  DATE OF DISCHARGE:  09/12/2017 PRIMARY CARE PHYSICIAN: Doreene Nest, NP   ADMISSION DIAGNOSIS:  Colitis [K52.9] Intractable vomiting with nausea, unspecified vomiting type [R11.2] DISCHARGE DIAGNOSIS:  Active Problems:   Acute colitis   Colitis  SECONDARY DIAGNOSIS:   Past Medical History:  Diagnosis Date  . Anxiety   . Diverticulitis large intestine   . Frequent headaches   . Gout    HOSPITAL COURSE:   The patient has no abdominal pain, nausea, vomiting but one episode of diarrhea.   1.Acute colitis. Continue  Flagyl and cipro. Discontinue IV fluid support. 2.Abdominal epigastric pain, started Protonix, follow-up with Dr. Tobi Bastos as outpatient.  3.Leukocytosis. Improved. I discussed with Dr. Servando Snare. DISCHARGE CONDITIONS:  Stable, discharge to home today. CONSULTS OBTAINED:   DRUG ALLERGIES:   Allergies  Allergen Reactions  . Morphine And Related     Patient believes that possibly in the past morphine made him itchy.   DISCHARGE MEDICATIONS:   Allergies as of 09/12/2017      Reactions   Morphine And Related    Patient believes that possibly in the past morphine made him itchy.      Medication List    TAKE these medications   CIPRO HC OTIC suspension Generic drug:  ciprofloxacin-hydrocortisone Place 3 drops in ear(s) 2 (two) times daily.   ciprofloxacin 500 MG tablet Commonly known as:  CIPRO Take 1 tablet (500 mg total) by mouth 2 (two) times daily.   escitalopram 10 MG tablet Commonly known as:  LEXAPRO TAKE 1 TABLET (10 MG TOTAL) BY MOUTH DAILY.   metroNIDAZOLE 500 MG tablet Commonly known as:  FLAGYL Take 1 tablet (500 mg total) by mouth every 8 (eight) hours.   pantoprazole 40 MG tablet Commonly known as:  PROTONIX Take 1 tablet  (40 mg total) by mouth daily.        DISCHARGE INSTRUCTIONS:  See AVS.   If you experience worsening of your admission symptoms, develop shortness of breath, life threatening emergency, suicidal or homicidal thoughts you must seek medical attention immediately by calling 911 or calling your MD immediately  if symptoms less severe.  You Must read complete instructions/literature along with all the possible adverse reactions/side effects for all the Medicines you take and that have been prescribed to you. Take any new Medicines after you have completely understood and accpet all the possible adverse reactions/side effects.   Please note  You were cared for by a hospitalist during your hospital stay. If you have any questions about your discharge medications or the care you received while you were in the hospital after you are discharged, you can call the unit and asked to speak with the hospitalist on call if the hospitalist that took care of you is not available. Once you are discharged, your primary care physician will handle any further medical issues. Please note that NO REFILLS for any discharge medications will be authorized once you are discharged, as it is imperative that you return to your primary care physician (or establish a relationship with a primary care physician if you do not have one) for your aftercare needs so that they can reassess your need for medications and monitor your lab values.    On the day of Discharge:  VITAL SIGNS:  Blood pressure 106/63, pulse 68, temperature 98.2 F (36.8 C), temperature source Oral, resp. rate 18, height  (1.854 m), weight 228 lb (103.4 kg), SpO2 96 %. PHYSICAL EXAMINATION:  GENERAL:  46 y.o.-year-old patient lying in the bed with no acute distress.  EYES: Pupils equal, round, reactive to light and accommodation. No scleral icterus. Extraocular muscles intact.  HEENT: Head atraumatic, normocephalic. Oropharynx and nasopharynx clear.    NECK:  Supple, no jugular venous distention. No thyroid enlargement, no tenderness.  LUNGS: Normal breath sounds bilaterally, no wheezing, rales,rhonchi or crepitation. No use of accessory muscles of respiration.  CARDIOVASCULAR: S1, S2 normal. No murmurs, rubs, or gallops.  ABDOMEN: Soft, non-tender, non-distended. Bowel sounds present. No organomegaly or mass.  EXTREMITIES: No pedal edema, cyanosis, or clubbing.  NEUROLOGIC: Cranial nerves II through XII are intact. Muscle strength 5/5 in all extremities. Sensation intact. Gait not checked.  PSYCHIATRIC: The patient is alert and oriented x 3.  SKIN: No obvious rash, lesion, or ulcer.  DATA REVIEW:   CBC  Recent Labs Lab 09/11/17 0415  WBC 8.7  HGB 13.9  HCT 40.0  PLT 312    Chemistries   Recent Labs Lab 09/10/17 1950 09/11/17 0415  NA 135 139  K 4.1 4.0  CL 99* 107  CO2 25 27  GLUCOSE 92 117*  BUN 9 7  CREATININE 1.27* 1.08  CALCIUM 9.7 8.8*  AST 23  --   ALT 19  --   ALKPHOS 49  --   BILITOT 0.9  --      Microbiology Results  No results found for this or any previous visit.  RADIOLOGY:  No results found.   Management plans discussed with the patient, family and they are in agreement.  CODE STATUS: Full Code   TOTAL TIME TAKING CARE OF THIS PATIENT: 22 minutes.    Shaune Pollack M.D on 09/12/2017 at 3:20 PM  Between 7am to 6pm - Pager - [phone]  After 6pm go to [email or web address] - Social research officer, government  [facility]  Office  [phone]  CC: Primary care physician; Doreene Nest, NP   Note: This dictation was prepared with Dragon dictation along with smaller phrase technology. Any transcriptional errors that result from this process are unintentional.

## 2017-09-12 NOTE — Discharge Instructions (Signed)
Avoid NSAIDS 

## 2017-09-12 NOTE — Progress Notes (Signed)
Discharge instructions as ordered; Patient voices understanding of discharge instructions.Work  Note given to patient and copy made for chart. Patient returns to work on next scheduled day.Patient waiting on ride for discharge. Denies pain at this time.

## 2017-09-12 NOTE — Progress Notes (Signed)
Patient discharged via w/c by staff with spouse by side; voices understanding of discharge instructions

## 2017-09-27 ENCOUNTER — Other Ambulatory Visit: Payer: Self-pay | Admitting: Primary Care

## 2017-09-27 DIAGNOSIS — Z0000 Encounter for general adult medical examination without abnormal findings: Secondary | ICD-10-CM

## 2017-10-07 ENCOUNTER — Other Ambulatory Visit (INDEPENDENT_AMBULATORY_CARE_PROVIDER_SITE_OTHER): Payer: Managed Care, Other (non HMO)

## 2017-10-07 DIAGNOSIS — Z0000 Encounter for general adult medical examination without abnormal findings: Secondary | ICD-10-CM

## 2017-10-07 LAB — BASIC METABOLIC PANEL
BUN: 9 mg/dL (ref 6–23)
CHLORIDE: 103 meq/L (ref 96–112)
CO2: 31 meq/L (ref 19–32)
CREATININE: 0.95 mg/dL (ref 0.40–1.50)
Calcium: 9.5 mg/dL (ref 8.4–10.5)
GFR: 90.48 mL/min (ref >60.00)
GLUCOSE: 96 mg/dL (ref 70–99)
Potassium: 4.5 mEq/L (ref 3.5–5.1)
Sodium: 139 mEq/L (ref 135–145)

## 2017-10-07 LAB — LIPID PANEL
CHOL/HDL RATIO: 4
Cholesterol: 174 mg/dL (ref 0–200)
HDL: 46.8 mg/dL (ref >39.00)
LDL CALC: 109 mg/dL — AB (ref 0–99)
NonHDL: 126.94
Triglycerides: 89 mg/dL (ref 0.0–149.0)
VLDL: 17.8 mg/dL (ref 0.0–40.0)

## 2017-10-07 LAB — HEMOGLOBIN A1C: HEMOGLOBIN A1C: 5.6 % (ref 4.6–6.5)

## 2017-10-14 ENCOUNTER — Ambulatory Visit (INDEPENDENT_AMBULATORY_CARE_PROVIDER_SITE_OTHER): Payer: Managed Care, Other (non HMO) | Admitting: Primary Care

## 2017-10-14 ENCOUNTER — Encounter: Payer: Self-pay | Admitting: Primary Care

## 2017-10-14 VITALS — BP 120/78 | HR 70 | Temp 98.3°F | Ht 73.0 in | Wt 226.4 lb

## 2017-10-14 DIAGNOSIS — Z0000 Encounter for general adult medical examination without abnormal findings: Secondary | ICD-10-CM

## 2017-10-14 DIAGNOSIS — K219 Gastro-esophageal reflux disease without esophagitis: Secondary | ICD-10-CM

## 2017-10-14 DIAGNOSIS — K529 Noninfective gastroenteritis and colitis, unspecified: Secondary | ICD-10-CM | POA: Diagnosis not present

## 2017-10-14 DIAGNOSIS — F411 Generalized anxiety disorder: Secondary | ICD-10-CM

## 2017-10-14 DIAGNOSIS — Z23 Encounter for immunization: Secondary | ICD-10-CM

## 2017-10-14 DIAGNOSIS — R351 Nocturia: Secondary | ICD-10-CM

## 2017-10-14 NOTE — Patient Instructions (Signed)
For acid reflux try ranitidine 150 mg once to twice daily as needed.  You were provided with a tetanus vaccination which will cover you for 10 years.   Start exercising. You should be getting 150 minutes of moderate intensity exercise weekly.  Limit fast food and restaurant food. Increase vegetables, fruit, whole grains.  Ensure you are consuming 64 ounces of water daily.  Please notify me if you are interesting in trying Flomax for urinary symptoms.  Follow up in 1 year for your annual exam.  It was a pleasure to see you today!

## 2017-10-14 NOTE — Assessment & Plan Note (Signed)
Suspect BPH as cause. Also with difficulty initiating his urinary stream. FH of BPH in father, no history of prostate cancer. Discussed treatment options including refraining from caffeine after 12 pm, limit liquids before bed. Also discussed Flomax, he will think about this.

## 2017-10-14 NOTE — Assessment & Plan Note (Signed)
Doing well on Lexapro 10 mg, continue same. 

## 2017-10-14 NOTE — Assessment & Plan Note (Signed)
Intermittent symptoms 2-3 times weekly. Was provided with pantoprazole 40 mg during his last hospital stay. Since symptoms are intermittent, will have him switch to OTC Zantac. He will update.

## 2017-10-14 NOTE — Addendum Note (Signed)
Addended by: Tawnya CrookSAMBATH, Briauna Gilmartin on: 10/14/2017 12:01 PM   Modules accepted: Orders

## 2017-10-14 NOTE — Assessment & Plan Note (Signed)
Admitted for acute colitis in mid October 2018.  Treated with IV and oral antibiotics.  Overall doing well, abdominal exam today unremarkable.

## 2017-10-14 NOTE — Progress Notes (Signed)
Subjective:    Patient ID: Darrell Lawson, male    DOB: 09/18/1971, 46 y.o.   MRN: [id number]  HPI  Darrell Lawson is a 46 year old male who presents today for complete physical.  Immunizations: -Tetanus: Unsure, believes it's been over 10 years. -Influenza: Completed this season   Diet: He endorses a healthy diet. Breakfast: Pop tarts Lunch: Fast food Dinner: Public affairs consultantestaurants, take out, pizza, pasta Snacks: None Desserts: Occasionally  Beverages: Water, vitamin water, occasional sweet tea  Exercise: Walking at work.  Eye exam: Completed threes year ago, due soon. Dental exam: Completes semi-annually Colonoscopy: Completed in 2008.   Review of Systems  Constitutional: Negative for unexpected weight change.  HENT: Negative for rhinorrhea.   Respiratory: Negative for cough and shortness of breath.   Cardiovascular: Negative for chest pain.  Gastrointestinal: Negative for constipation and diarrhea.  Genitourinary: Negative for difficulty urinating.  Musculoskeletal: Negative for arthralgias and myalgias.  Skin: Negative for rash.  Allergic/Immunologic: Positive for environmental allergies.  Neurological: Negative for dizziness, numbness and headaches.  Psychiatric/Behavioral:       Denies concerns for anxiety and depression       Past Medical History:  Diagnosis Date  . Anxiety   . Diverticulitis large intestine   . Frequent headaches   . Gout      Social History   Socioeconomic History  . Marital status: Married    Spouse name: Not on file  . Number of children: Not on file  . Years of education: Not on file  . Highest education level: Not on file  Social Needs  . Financial resource strain: Not on file  . Food insecurity - worry: Not on file  . Food insecurity - inability: Not on file  . Transportation needs - medical: Not on file  . Transportation needs - non-medical: Not on file  Occupational History    Employer: [employer]  Tobacco Use  .  Smoking status: Never Smoker  . Smokeless tobacco: Never Used  Substance and Sexual Activity  . Alcohol use: No  . Drug use: No  . Sexual activity: Yes  Other Topics Concern  . Not on file  Social History Narrative  . Not on file    Past Surgical History:  Procedure Laterality Date  . EYE SURGERY Left    age 89    Family History  Problem Relation Age of Onset  . Hypertension Mother   . Hypertension Father   . Heart disease Father   . Diabetes Father   . Cancer Maternal Grandmother        breast    Allergies  Allergen Reactions  . Morphine And Related     Patient believes that possibly in the past morphine made him itchy.    Current Outpatient Medications on File Prior to Visit  Medication Sig Dispense Refill  . escitalopram (LEXAPRO) 10 MG tablet TAKE 1 TABLET (10 MG TOTAL) BY MOUTH DAILY. 90 tablet 1  . CIPRO HC OTIC suspension Place 3 drops in ear(s) 2 (two) times daily.  0  . pantoprazole (PROTONIX) 40 MG tablet Take 1 tablet (40 mg total) by mouth daily. (Patient not taking: Reported on 10/14/2017) 14 tablet 0   No current facility-administered medications on file prior to visit.     BP 120/78   Pulse 70   Temp 98.3 F (36.8 C) (Oral)   Ht 6\' 1"  (1.854 m)   Wt 226 lb 6.4 oz (102.7 kg)  SpO2 98%   BMI 29.87 kg/m    Objective:   Physical Exam  Constitutional: He is oriented to person, place, and time. He appears well-nourished.  HENT:  Right Ear: Tympanic membrane and ear canal normal.  Left Ear: Tympanic membrane and ear canal normal.  Nose: Nose normal. Right sinus exhibits no maxillary sinus tenderness and no frontal sinus tenderness. Left sinus exhibits no maxillary sinus tenderness and no frontal sinus tenderness.  Mouth/Throat: Oropharynx is clear and moist.  Eyes: Conjunctivae and EOM are normal. Pupils are equal, round, and reactive to light.  Neck: Neck supple. Carotid bruit is not present. No thyromegaly present.  Cardiovascular: Normal  rate, regular rhythm and normal heart sounds.  Pulmonary/Chest: Effort normal and breath sounds normal. He has no wheezes. He has no rales.  Abdominal: Soft. Bowel sounds are normal. There is no tenderness.  Musculoskeletal: Normal range of motion.  Neurological: He is alert and oriented to person, place, and time. He has normal reflexes. No cranial nerve deficit.  Skin: Skin is warm and dry.  Psychiatric: He has a normal mood and affect.          Assessment & Plan:

## 2017-10-14 NOTE — Assessment & Plan Note (Signed)
Td due, provided today. Influenza vaccination UTD. Discussed the importance of a healthy diet and regular exercise in order for weight loss, and to reduce the risk of other medical problems. Exam unremarkable.  Labs unremarkable. Follow up in 1 year.

## 2017-11-09 ENCOUNTER — Ambulatory Visit: Payer: Managed Care, Other (non HMO) | Admitting: Gastroenterology

## 2017-11-16 ENCOUNTER — Ambulatory Visit: Payer: Managed Care, Other (non HMO) | Admitting: Gastroenterology

## 2017-11-16 ENCOUNTER — Encounter: Payer: Self-pay | Admitting: Gastroenterology

## 2017-11-16 VITALS — BP 137/80 | HR 77 | Temp 97.6°F | Ht 73.0 in | Wt 226.6 lb

## 2017-11-16 DIAGNOSIS — K625 Hemorrhage of anus and rectum: Secondary | ICD-10-CM | POA: Diagnosis not present

## 2017-11-16 NOTE — Addendum Note (Signed)
Addended by: Ardyth ManARTER, Andris Brothers Z on: 11/16/2017 03:26 PM   Modules accepted: Orders, SmartSet

## 2017-11-16 NOTE — Progress Notes (Signed)
Wyline MoodKiran Jc Veron MD, MRCP(U.K) [address]  [address]  [place], [place] [address]  Main: [phone]  Fax: [phone]   Primary Care Physician: Doreene Nestlark, Katherine K, NP  Primary Gastroenterologist:  Dr. Wyline MoodKiran Yahsir Wickens   Chief Complaint  Patient presents with  . Rectal Bleeding    HPI: Darrell Lawson is a 46 y.o. male    Summary of history :  He is here to see me after 1 year to his last visit.   He was seen by his primary care physician 10/15/16 for abdominal pain , rectal bleeding on 10/15/16 and hence was referred to GI for possible colonoscopy. He has had about 5-6 episodes of diverticulitis, usually develops abdominal pain below his belly button, usually he says he tries to get as much fiber as he can .Fast food brings it on quicker. Denies any associated fevers, chills,diarrhea. Just abdominal pain. Usually treated by his doctor with augmentin, helps after a few days.Labs 10/15/16 : HB 13.9, MCV 91 Cr 1.0, FOBT positive.    Interval history   11/17/16-11/16/17  He was admitted ub 08/2017 and seen by Dr Servando SnareWohl for vomiting . Treated for an infectious gastroenteritis with antibiotics while in hospital and discharged. Since discharge - noticed some blood in his stool. Sometimes has diarrhea.   No abdominal pain, no weight loss. No family history of colon cancer. Father had colon polyps.   Ct scan of the abdomen 09/10/2017- Mild wall thickening along the sigmoid colon, concerning for an acute infectious or inflammatory process.Mild distention of small-bowel loops to 3.1 cm in diameter, with mild surrounding soft tissue inflammation and fecalization of the distal ileum. This raises concern for mild ileitis and dysmotility. No evidence of bowel obstruction.   Current Outpatient Medications  Medication Sig Dispense Refill  . escitalopram (LEXAPRO) 10 MG tablet TAKE 1 TABLET (10 MG TOTAL) BY MOUTH DAILY. 90 tablet 1   No current facility-administered medications for this visit.      Allergies as of 11/16/2017 - Review Complete 11/16/2017  Allergen Reaction Noted  . Morphine and related  09/11/2017    ROS:  General: Negative for anorexia, weight loss, fever, chills, fatigue, weakness. ENT: Negative for hoarseness, difficulty swallowing , nasal congestion. CV: Negative for chest pain, angina, palpitations, dyspnea on exertion, peripheral edema.  Respiratory: Negative for dyspnea at rest, dyspnea on exertion, cough, sputum, wheezing.  GI: See history of present illness. GU:  Negative for dysuria, hematuria, urinary incontinence, urinary frequency, nocturnal urination.  Endo: Negative for unusual weight change.    Physical Examination:   BP 137/80 (BP Location: Left Arm, Patient Position: Sitting, Cuff Size: Large)   Pulse 77   Temp 97.6 F (36.4 C) (Oral)   Ht 6\' 1"  (1.854 m)   Wt 226 lb 9.6 oz (102.8 kg)   BMI 29.90 kg/m   General: Well-nourished, well-developed in no acute distress.  Eyes: No icterus. Conjunctivae pink. Mouth: Oropharyngeal mucosa moist and pink , no lesions erythema or exudate. Lungs: Clear to auscultation bilaterally. Non-labored. Heart: Regular rate and rhythm, no murmurs rubs or gallops.  Abdomen: Bowel sounds are normal, nontender, nondistended, no hepatosplenomegaly or masses, no abdominal bruits or hernia , no rebound or guarding.   Extremities: No lower extremity edema. No clubbing or deformities. Neuro: Alert and oriented x 3.  Grossly intact. Skin: Warm and dry, no jaundice.   Psych: Alert and cooperative, normal mood and affect.   Imaging Studies: No results found.  Assessment and Plan:  Darrell Lawson is a 46 y.o. y/o male here to follow up after recent hospital discharge for an infectious gastroenteritis. He had last seen me 1 year back for  recurrent diverticulitis and rectal bleeding .He didn't follow up after last year. Will proceed with colonoscopy this Friday .   I have discussed alternative options, risks &  benefits,  which include, but are not limited to, bleeding, infection, perforation,respiratory complication & drug reaction.  The patient agrees with this plan & written consent will be obtained.    Dr Wyline MoodKiran Alnita Aybar  MD,MRCP [facility](U.K) Follow up in 12 weeks.

## 2017-11-20 ENCOUNTER — Encounter: Payer: Self-pay | Admitting: *Deleted

## 2017-11-20 ENCOUNTER — Encounter
Admission: RE | Disposition: A | Discharge status: Home / Self Care | Payer: Self-pay | Source: Ambulatory Visit | Attending: Gastroenterology

## 2017-11-20 ENCOUNTER — Ambulatory Visit: Payer: Managed Care, Other (non HMO) | Admitting: Anesthesiology

## 2017-11-20 ENCOUNTER — Ambulatory Visit
Admission: RE | Admit: 2017-11-20 | Discharge: 2017-11-20 | Disposition: A | Discharge status: Home / Self Care | Payer: Managed Care, Other (non HMO) | Source: Ambulatory Visit | Attending: Gastroenterology | Admitting: Gastroenterology

## 2017-11-20 DIAGNOSIS — K625 Hemorrhage of anus and rectum: Secondary | ICD-10-CM

## 2017-11-20 DIAGNOSIS — Z803 Family history of malignant neoplasm of breast: Secondary | ICD-10-CM | POA: Insufficient documentation

## 2017-11-20 DIAGNOSIS — K640 First degree hemorrhoids: Secondary | ICD-10-CM | POA: Insufficient documentation

## 2017-11-20 DIAGNOSIS — F419 Anxiety disorder, unspecified: Secondary | ICD-10-CM | POA: Diagnosis not present

## 2017-11-20 DIAGNOSIS — Z79899 Other long term (current) drug therapy: Secondary | ICD-10-CM | POA: Diagnosis not present

## 2017-11-20 DIAGNOSIS — Z833 Family history of diabetes mellitus: Secondary | ICD-10-CM | POA: Diagnosis not present

## 2017-11-20 DIAGNOSIS — K219 Gastro-esophageal reflux disease without esophagitis: Secondary | ICD-10-CM | POA: Diagnosis not present

## 2017-11-20 DIAGNOSIS — Z888 Allergy status to other drugs, medicaments and biological substances status: Secondary | ICD-10-CM | POA: Diagnosis not present

## 2017-11-20 DIAGNOSIS — K5730 Diverticulosis of large intestine without perforation or abscess without bleeding: Secondary | ICD-10-CM | POA: Diagnosis not present

## 2017-11-20 DIAGNOSIS — Z8249 Family history of ischemic heart disease and other diseases of the circulatory system: Secondary | ICD-10-CM | POA: Diagnosis not present

## 2017-11-20 HISTORY — PX: COLONOSCOPY WITH PROPOFOL: SHX5780

## 2017-11-20 SURGERY — COLONOSCOPY WITH PROPOFOL
Anesthesia: General

## 2017-11-20 MED ORDER — PROPOFOL 10 MG/ML IV BOLUS
INTRAVENOUS | Status: DC | PRN
  Administered 2017-11-20: 100 mg via INTRAVENOUS

## 2017-11-20 MED ORDER — FENTANYL CITRATE (PF) 100 MCG/2ML IJ SOLN: 25.0000 ug | INTRAMUSCULAR | Status: DC | PRN

## 2017-11-20 MED ORDER — SODIUM CHLORIDE 0.9 % IV SOLN
INTRAVENOUS | Status: DC
  Administered 2017-11-20 (×2): via INTRAVENOUS

## 2017-11-20 MED ORDER — PROPOFOL 500 MG/50ML IV EMUL
INTRAVENOUS | Status: DC | PRN
  Administered 2017-11-20: 150 ug/kg/min via INTRAVENOUS

## 2017-11-20 MED ORDER — ONDANSETRON HCL 4 MG/2ML IJ SOLN: 4.0000 mg | Freq: Once | INTRAMUSCULAR | Status: DC | PRN

## 2017-11-20 MED ORDER — PROPOFOL 500 MG/50ML IV EMUL
INTRAVENOUS | Status: AC
  Filled 2017-11-20: qty 50

## 2017-11-20 NOTE — Transfer of Care (Signed)
Immediate Anesthesia Transfer of Care Note  Patient: Darrell Lawson  Procedure(s) Performed: COLONOSCOPY WITH PROPOFOL (N/A )  Patient Location: PACU  Anesthesia Type:General  Level of Consciousness: sedated  Airway & Oxygen Therapy: Patient Spontanous Breathing and Patient connected to nasal cannula oxygen  Post-op Assessment: Report given to RN and Post -op Vital signs reviewed and stable  Post vital signs: Reviewed and stable  Last Vitals:  Vitals:   11/20/17 0924  BP: 130/80  Pulse: 96  Resp: 18  Temp: (!) 35.3 C  SpO2: 98%    Last Pain:  Vitals:   11/20/17 0924  TempSrc: Tympanic         Complications: No apparent anesthesia complications

## 2017-11-20 NOTE — H&P (Signed)
     Darrell MoodKiran Bryna Razavi, MD [address], [address], [place], [place], [address] [address], [address], [place], [place], [address] Phone: [phone]  Fax: [phone]  Primary Care Physician:  Doreene Nestlark, Katherine K, NP   Pre-Procedure History & Physical: HPI:  Darrell FractionJames R Lawson is a 46 y.o. male is here for an colonoscopy.   Past Medical History:  Diagnosis Date  . Anxiety   . Diverticulitis large intestine   . Frequent headaches   . Gout     Past Surgical History:  Procedure Laterality Date  . EYE SURGERY Left    age 89    Prior to Admission medications   Medication Sig Start Date End Date Taking? Authorizing Provider  escitalopram (LEXAPRO) 10 MG tablet TAKE 1 TABLET (10 MG TOTAL) BY MOUTH DAILY. 05/10/17   Doreene Nestlark, Katherine K, NP    Allergies as of 11/16/2017 - Review Complete 11/16/2017  Allergen Reaction Noted  . Morphine and related  09/11/2017    Family History  Problem Relation Age of Onset  . Hypertension Mother   . Hypertension Father   . Heart disease Father   . Diabetes Father   . Cancer Maternal Grandmother        breast    Social History   Socioeconomic History  . Marital status: Married    Spouse name: Not on file  . Number of children: Not on file  . Years of education: Not on file  . Highest education level: Not on file  Social Needs  . Financial resource strain: Not on file  . Food insecurity - worry: Not on file  . Food insecurity - inability: Not on file  . Transportation needs - medical: Not on file  . Transportation needs - non-medical: Not on file  Occupational History    Employer: [employer]  Tobacco Use  . Smoking status: Never Smoker  . Smokeless tobacco: Never Used  Substance and Sexual Activity  . Alcohol use: No  . Drug use: No  . Sexual activity: Yes  Other Topics Concern  . Not on file  Social History Narrative  . Not on file    Review of Systems: See HPI, otherwise negative ROS  Physical Exam: BP 130/80    Pulse 96   Temp (!) 95.6 F (35.3 C) (Tympanic)   Resp 18   Ht 6\' 1"  (1.854 m)   Wt 226 lb (102.5 kg)   SpO2 98%   BMI 29.82 kg/m  General:   Alert,  pleasant and cooperative in NAD Head:  Normocephalic and atraumatic. Neck:  Supple; no masses or thyromegaly. Lungs:  Clear throughout to auscultation, normal respiratory effort.    Heart:  +S1, +S2, Regular rate and rhythm, No edema. Abdomen:  Soft, nontender and nondistended. Normal bowel sounds, without guarding, and without rebound.   Neurologic:  Alert and  oriented x4;  grossly normal neurologically.  Impression/Plan: Darrell Lawson is here for an colonoscopy to be performed for rectal bleeding. Risks, benefits, limitations, and alternatives regarding  colonoscopy have been reviewed with the patient.  Questions have been answered.  All parties agreeable.   Darrell MoodKiran Lakoda Raske, MD  11/20/2017, 9:30 AM

## 2017-11-20 NOTE — Anesthesia Postprocedure Evaluation (Signed)
Anesthesia Post Note  Patient: Darrell Lawson  Procedure(s) Performed: COLONOSCOPY WITH PROPOFOL (N/A )  Patient location during evaluation: PACU Anesthesia Type: General Level of consciousness: awake and alert and oriented Pain management: pain level controlled Vital Signs Assessment: post-procedure vital signs reviewed and stable Respiratory status: spontaneous breathing Cardiovascular status: blood pressure returned to baseline Anesthetic complications: no     Last Vitals:  Vitals:   11/20/17 1108 11/20/17 1118  BP: 127/86 125/82  Pulse: 76 66  Resp: 14 16  Temp:    SpO2: 99% 100%    Last Pain:  Vitals:   11/20/17 1048  TempSrc: Tympanic                 [name]

## 2017-11-20 NOTE — Anesthesia Procedure Notes (Signed)
Date/Time: 11/20/2017 10:29 AM Performed by: Junious SilkNoles, Nayelis Bonito, CRNA Pre-anesthesia Checklist: Patient identified, Emergency Drugs available, Suction available, Patient being monitored and Timeout performed Oxygen Delivery Method: Nasal cannula

## 2017-11-20 NOTE — Anesthesia Preprocedure Evaluation (Signed)
Anesthesia Evaluation  Patient identified by MRN, date of birth, ID band Patient awake    Reviewed: Allergy & Precautions, NPO status , Patient's Chart, lab work & pertinent test results  Airway Mallampati: II  TM Distance: >3 FB     Dental  (+) Teeth Intact   Pulmonary neg pulmonary ROS,    Pulmonary exam normal        Cardiovascular negative cardio ROS Normal cardiovascular exam     Neuro/Psych  Headaches, PSYCHIATRIC DISORDERS Anxiety    GI/Hepatic Neg liver ROS, GERD  Medicated,  Endo/Other  negative endocrine ROS  Renal/GU negative Renal ROS  negative genitourinary   Musculoskeletal negative musculoskeletal ROS (+)   Abdominal Normal abdominal exam  (+)   Peds negative pediatric ROS (+)  Hematology negative hematology ROS (+)   Anesthesia Other Findings   Reproductive/Obstetrics                             Anesthesia Physical Anesthesia Plan  ASA: II  Anesthesia Plan: General   Post-op Pain Management:    Induction: Intravenous  PONV Risk Score and Plan:   Airway Management Planned: Nasal Cannula  Additional Equipment:   Intra-op Plan:   Post-operative Plan:   Informed Consent: I have reviewed the patients History and Physical, chart, labs and discussed the procedure including the risks, benefits and alternatives for the proposed anesthesia with the patient or authorized representative who has indicated his/her understanding and acceptance.   Dental advisory given  Plan Discussed with: CRNA and Surgeon  Anesthesia Plan Comments:         Anesthesia Quick Evaluation

## 2017-11-20 NOTE — Anesthesia Post-op Follow-up Note (Signed)
Anesthesia QCDR form completed.        

## 2017-11-20 NOTE — Op Note (Signed)
[facility] Gastroenterology Patient Name: Darrell Lawson Procedure Date: 11/20/2017 10:18 AM MRN: [id number] Account #: [id number] Date of Birth: Jan 28, 1971 Admit Type: Outpatient Age: 46 Room: [facility] ENDO ROOM 4 Gender: Male Note Status: Finalized Procedure:            Colonoscopy Indications:          Rectal bleeding, Follow-up of diverticulitis Providers:            Wyline Mood MD, MD Referring MD:         Doreene Nest (Referring MD) Medicines:            Monitored Anesthesia Care Complications:        No immediate complications. Procedure:            Pre-Anesthesia Assessment:                       - Prior to the procedure, a History and Physical was                        performed, and patient medications, allergies and                        sensitivities were reviewed. The patient's tolerance of                        previous anesthesia was reviewed.                       - The risks and benefits of the procedure and the                        sedation options and risks were discussed with the                        patient. All questions were answered and informed                        consent was obtained.                       - ASA Grade Assessment: II - A patient with mild                        systemic disease.                       After obtaining informed consent, the colonoscope was                        passed under direct vision. Throughout the procedure,                        the patient's blood pressure, pulse, and oxygen                        saturations were monitored continuously. The                        Colonoscope was introduced through the anus and  advanced to the the terminal ileum. The colonoscopy was                        performed with ease. The patient tolerated the                        procedure well. The quality of the bowel preparation                        was good. Findings:      The  perianal and digital rectal examinations were normal.      Multiple medium-mouthed diverticula were found in the entire colon.      Non-bleeding internal hemorrhoids were found during retroflexion. The       hemorrhoids were small and Grade I (internal hemorrhoids that do not       prolapse).      The exam was otherwise without abnormality on direct and retroflexion       views.      The terminal ileum appeared normal. Impression:           - Diverticulosis in the entire examined colon.                       - Non-bleeding internal hemorrhoids.                       - The examination was otherwise normal on direct and                        retroflexion views.                       - No specimens collected. Recommendation:       - Discharge patient to home (with escort).                       - Resume previous diet.                       - Continue present medications.                       - Repeat colonoscopy in 10 years for screening purposes. Procedure Code(s):    --- Professional ---                       [phone], Colonoscopy, flexible; diagnostic, including                        collection of specimen(s) by brushing or washing, when                        performed (separate procedure) Diagnosis Code(s):    --- Professional ---                       K64.0, First degree hemorrhoids                       K62.5, Hemorrhage of anus and rectum                       K57.30, Diverticulosis of large intestine without  perforation or abscess without bleeding                       K57.32, Diverticulitis of large intestine without                        perforation or abscess without bleeding CPT copyright 2016 American Medical Association. All rights reserved. The codes documented in this report are preliminary and upon coder review may  be revised to meet current compliance requirements. Wyline MoodKiran Jourdyn Hasler, MD Wyline MoodKiran Levone Otten MD, MD 11/20/2017 10:45:42 AM This report has been  signed electronically. Number of Addenda: 0 Note Initiated On: 11/20/2017 10:18 AM Scope Withdrawal Time: 0 hours 7 minutes 26 seconds  Total Procedure Duration: 0 hours 9 minutes 19 seconds       [facility]

## 2017-11-25 ENCOUNTER — Encounter: Payer: Self-pay | Admitting: Gastroenterology

## 2018-02-05 ENCOUNTER — Emergency Department: Payer: 59

## 2018-02-05 ENCOUNTER — Encounter: Payer: Self-pay | Admitting: Emergency Medicine

## 2018-02-05 ENCOUNTER — Emergency Department
Admission: EM | Admit: 2018-02-05 | Discharge: 2018-02-05 | Disposition: A | Discharge status: Home / Self Care | Payer: 59 | Attending: Emergency Medicine | Admitting: Emergency Medicine

## 2018-02-05 ENCOUNTER — Other Ambulatory Visit: Payer: Self-pay

## 2018-02-05 DIAGNOSIS — J40 Bronchitis, not specified as acute or chronic: Secondary | ICD-10-CM | POA: Insufficient documentation

## 2018-02-05 DIAGNOSIS — R079 Chest pain, unspecified: Secondary | ICD-10-CM | POA: Diagnosis present

## 2018-02-05 LAB — CBC
HEMATOCRIT: 44.5 % (ref 40.0–52.0)
HEMOGLOBIN: 15.3 g/dL (ref 13.0–18.0)
MCH: 31.6 pg (ref 26.0–34.0)
MCHC: 34.4 g/dL (ref 32.0–36.0)
MCV: 92 fL (ref 80.0–100.0)
Platelets: 327 10*3/uL (ref 150–440)
RBC: 4.84 MIL/uL (ref 4.40–5.90)
RDW: 13 % (ref 11.5–14.5)
WBC: 8.7 10*3/uL (ref 3.8–10.6)

## 2018-02-05 LAB — BASIC METABOLIC PANEL
ANION GAP: 10 (ref 5–15)
BUN: 10 mg/dL (ref 6–20)
CHLORIDE: 102 mmol/L (ref 101–111)
CO2: 24 mmol/L (ref 22–32)
Calcium: 9.2 mg/dL (ref 8.9–10.3)
Creatinine, Ser: 1.02 mg/dL (ref 0.61–1.24)
GFR calc Af Amer: >60 mL/min (ref >60)
Glucose, Bld: 144 mg/dL — ABNORMAL HIGH (ref 65–99)
POTASSIUM: 3.9 mmol/L (ref 3.5–5.1)
Sodium: 136 mmol/L (ref 135–145)

## 2018-02-05 LAB — TROPONIN I: Troponin I: <0.03 ng/mL (ref <0.03)

## 2018-02-05 MED ORDER — GUAIFENESIN-CODEINE 100-10 MG/5ML PO SOLN
5.0000 mL | Freq: Four times a day (QID) | ORAL | 0 refills | Status: DC | PRN
Start: 2018-02-05 — End: 2018-09-16

## 2018-02-05 MED ORDER — PREDNISONE 10 MG PO TABS: 10.0000 mg | ORAL_TABLET | Freq: Every day | ORAL | 0 refills | Status: DC

## 2018-02-05 MED ORDER — PREDNISONE 20 MG PO TABS
60.0000 mg | ORAL_TABLET | Freq: Once | ORAL | Status: AC
  Administered 2018-02-05: 60 mg via ORAL
  Filled 2018-02-05: qty 3

## 2018-02-05 NOTE — ED Triage Notes (Addendum)
Pt reports cold symptoms for about 3 weeks; has been to his doctor twice since; put on antibiotics the first time; second time put on a different antibiotic and steroid; pt says he was not given a diagnosis; pt also reports feeling dizzy for the last few days; hypertensive at home prior to arrival -155/98; still feeling dizzy; denies fever at home; nonproductive cough; pt reports chest tightness, primarily on right side but some on the left side; fine crackles to right lower lobe

## 2018-02-05 NOTE — ED Provider Notes (Signed)
[facility] Emergency Department Provider Note  Time seen: 10:38 PM  I have reviewed the triage vital signs and the nursing notes.   HISTORY  Chief Complaint Chest Pain and Shortness of Breath    HPI Darrell Lawson is a 47 y.o. male with a past medical history of anxiety, gastric reflux, presents to the emergency department for cough, congestion tingling.  According to the patient for the past 2 weeks he has had a frequent cough, dry cough no sputum production.  He went to his primary care doctor 2 weeks ago was prescribed a short course of antibiotics, went 1 week ago after having minimal to no improvement and received a second dose/course of antibiotics as well as a one-time shot of steroids.  Patient states after the steroids he felt considerably better for 1 or 2 days and then he states the cough returned.  Denies any chest pain or trouble breathing.  States continued cough.  Denies any fever.  Patient states at times he will feel tingling in his hands and his feet but denies any currently.   Past Medical History:  Diagnosis Date  . Anxiety   . Diverticulitis large intestine   . Frequent headaches   . Gout     Patient Active Problem List   Diagnosis Date Noted  . Nocturia 10/14/2017  . GERD (gastroesophageal reflux disease) 10/14/2017  . Colitis   . Preventative health care 10/01/2016  . GAD (generalized anxiety disorder) 08/20/2016  . Chronic maxillary sinusitis 01/23/2016  . Diverticulitis of colon 10/12/2013    Past Surgical History:  Procedure Laterality Date  . COLONOSCOPY WITH PROPOFOL N/A 11/20/2017   Procedure: COLONOSCOPY WITH PROPOFOL;  Surgeon: Wyline MoodAnna, Kiran, MD;  [facility] ENDOSCOPY;  Service: Gastroenterology;  Laterality: N/A;  . EYE SURGERY Left    age 89    Prior to Admission medications   Medication Sig Start Date End Date Taking? Authorizing Provider  escitalopram (LEXAPRO) 10 MG tablet TAKE 1 TABLET (10 MG TOTAL) BY MOUTH  DAILY. 05/10/17  Yes Doreene Nestlark, Katherine K, NP    Allergies  Allergen Reactions  . Morphine And Related     Patient believes that possibly in the past morphine made him itchy.    Family History  Problem Relation Age of Onset  . Hypertension Mother   . Hypertension Father   . Heart disease Father   . Diabetes Father   . Cancer Maternal Grandmother        breast    Social History Social History   Tobacco Use  . Smoking status: Never Smoker  . Smokeless tobacco: Never Used  Substance Use Topics  . Alcohol use: No  . Drug use: No    Review of Systems Constitutional: Negative for fever. Eyes: Negative for visual complaints ENT: Negative for recent illness/congestion Cardiovascular: Negative for chest pain.  Slight tightness in the chest. Respiratory: Negative for shortness of breath.  Positive for dry cough. Gastrointestinal: Negative for abdominal pain, vomiting and diarrhea. Genitourinary: Negative for urinary compaints Musculoskeletal: Negative for leg pain or swelling Skin: Negative for skin complaints  Neurological: Negative for headache All other ROS negative  ____________________________________________   PHYSICAL EXAM:  VITAL SIGNS: ED Triage Vitals  Enc Vitals Group     BP 02/05/18 1921 (!) 140/92     Pulse Rate 02/05/18 1921 97     Resp 02/05/18 1921 18     Temp 02/05/18 1921 98.1 F (36.7 C)     Temp Source  02/05/18 1921 Oral     SpO2 02/05/18 1921 100 %     Weight 02/05/18 1924 220 lb (99.8 kg)     Height 02/05/18 1924 6\' 1"  (1.854 m)     Head Circumference --      Peak Flow --      Pain Score 02/05/18 1922 6     Pain Loc --      Pain Edu? --      Excl. in GC? --     Constitutional: Alert and oriented. Well appearing and in no distress. Eyes: Normal exam ENT   Head: Normocephalic and atraumatic.   Mouth/Throat: Mucous membranes are moist. Cardiovascular: Normal rate, regular rhythm. No murmur Respiratory: Normal respiratory effort  without tachypnea nor retractions. Breath sounds are clear  Gastrointestinal: Soft and nontender. No distention. Musculoskeletal: Nontender with normal range of motion in all extremities. No lower extremity tenderness or edema. Neurologic:  Normal speech and language. No gross focal neurologic deficits  Skin:  Skin is warm, dry and intact.  Psychiatric: Mood and affect are normal.   ____________________________________________    EKG  EKG reviewed and interpreted by myself shows normal sinus rhythm at 99 bpm with a narrow QRS, normal axis, normal intervals, no concerning ST changes.  Normal EKG.  ____________________________________________    RADIOLOGY  Chest x-ray clear  ____________________________________________   INITIAL IMPRESSION / ASSESSMENT AND PLAN / ED COURSE  Pertinent labs & imaging results that were available during my care of the patient were reviewed by me and considered in my medical decision making (see chart for details).  Patient presents emergency department for cough ongoing for the past 2 weeks.  Differential would include upper respiratory infection, bronchitis, pneumonia, pneumothorax, ACS.  Overall the patient appears extremely well, labs are normal including white blood cell count, negative troponin.  Patient has a clear chest x-ray, clear lung sounds.  X-ray is normal.  Occasional cough during exam.  Overall history most consistent with bronchitis.  He is undergone 2 courses of antibiotics we will discharge with a course of steroids and have the patient follow-up with his primary care doctor.  I discussed return precautions.  Patient agreeable this plan of care.  ____________________________________________   FINAL CLINICAL IMPRESSION(S) / ED DIAGNOSES  Bronchitis    Minna Antis, MD 02/05/18 2241

## 2018-02-08 ENCOUNTER — Telehealth: Payer: Self-pay

## 2018-02-08 ENCOUNTER — Ambulatory Visit: Payer: Managed Care, Other (non HMO) | Admitting: Primary Care

## 2018-02-08 NOTE — Telephone Encounter (Signed)
PLEASE NOTE: All timestamps contained within this report are represented as [place] Standard Time. CONFIDENTIALTY NOTICE: This fax transmission is intended only for the addressee. It contains information that is legally privileged, confidential or otherwise protected from use or disclosure. If you are not the intended recipient, you are strictly prohibited from reviewing, disclosing, copying using or disseminating any of this information or taking any action in reliance on or regarding this information. If you have received this fax in error, please notify us immediately by telephone so that we can arrange for its return to us. Phone: [phone], Toll-Free: [phone], Fax: [phone] Page: 1 of 1 Call Id: [id number] [address] [facility] - Client Nonclinical Telephone Record [facility] Client [place] [facility] - Client [facility] - Night Contact Type Call Who Is Calling Patient / Member / Family / Caregiver Caller Name Ferman HammingJames Smalling Caller Phone Number [phone] Patient Name Ferman HammingJames Bonini Patient DOB Feb 17, 1971 Call Type Message Only Information Provided [facility] Initial Comment Caller stated he has an appointment for 3/11 that he needs to cancel. Additional Comment Call Closed By: Roby LoftsShannon Young Transaction Date/Time: 02/06/2018 7:52:17 PM (ET)

## 2018-07-03 ENCOUNTER — Emergency Department
Admission: EM | Admit: 2018-07-03 | Discharge: 2018-07-04 | Disposition: A | Discharge status: Home / Self Care | Payer: 59 | Attending: Emergency Medicine | Admitting: Emergency Medicine

## 2018-07-03 ENCOUNTER — Emergency Department: Payer: 59

## 2018-07-03 ENCOUNTER — Other Ambulatory Visit: Payer: Self-pay

## 2018-07-03 DIAGNOSIS — M5441 Lumbago with sciatica, right side: Secondary | ICD-10-CM | POA: Diagnosis not present

## 2018-07-03 DIAGNOSIS — M545 Low back pain: Secondary | ICD-10-CM | POA: Diagnosis present

## 2018-07-03 DIAGNOSIS — Z79899 Other long term (current) drug therapy: Secondary | ICD-10-CM | POA: Diagnosis not present

## 2018-07-03 LAB — URINALYSIS, COMPLETE (UACMP) WITH MICROSCOPIC
Bacteria, UA: NONE SEEN
Bilirubin Urine: NEGATIVE
Glucose, UA: NEGATIVE mg/dL
Hgb urine dipstick: NEGATIVE
Ketones, ur: NEGATIVE mg/dL
Leukocytes, UA: NEGATIVE
Nitrite: NEGATIVE
Protein, ur: NEGATIVE mg/dL
Specific Gravity, Urine: 1.021 (ref 1.005–1.030)
Squamous Epithelial / HPF: NONE SEEN (ref 0–5)
pH: 6 (ref 5.0–8.0)

## 2018-07-03 MED ORDER — ONDANSETRON 4 MG PO TBDP
4.0000 mg | ORAL_TABLET | Freq: Once | ORAL | Status: AC
  Administered 2018-07-03: 4 mg via ORAL
  Filled 2018-07-03: qty 1

## 2018-07-03 MED ORDER — METHOCARBAMOL 500 MG PO TABS: 500.0000 mg | ORAL_TABLET | Freq: Three times a day (TID) | ORAL | 0 refills | Status: AC | PRN

## 2018-07-03 MED ORDER — OXYCODONE-ACETAMINOPHEN 5-325 MG PO TABS
1.0000 | ORAL_TABLET | Freq: Once | ORAL | Status: AC
  Administered 2018-07-03: 1 via ORAL
  Filled 2018-07-03: qty 1

## 2018-07-03 MED ORDER — PREDNISONE 50 MG PO TABS: ORAL_TABLET | ORAL | 0 refills | Status: DC

## 2018-07-03 MED ORDER — METHYLPREDNISOLONE SODIUM SUCC 125 MG IJ SOLR
125.0000 mg | Freq: Once | INTRAMUSCULAR | Status: AC
  Administered 2018-07-03: 125 mg via INTRAMUSCULAR
  Filled 2018-07-03: qty 2

## 2018-07-03 MED ORDER — KETOROLAC TROMETHAMINE 30 MG/ML IJ SOLN
30.0000 mg | Freq: Once | INTRAMUSCULAR | Status: AC
  Administered 2018-07-03: 30 mg via INTRAMUSCULAR
  Filled 2018-07-03: qty 1

## 2018-07-03 MED ORDER — METHOCARBAMOL 500 MG PO TABS
1000.0000 mg | ORAL_TABLET | Freq: Once | ORAL | Status: AC
  Administered 2018-07-03: 1000 mg via ORAL
  Filled 2018-07-03: qty 2

## 2018-07-03 NOTE — ED Notes (Signed)
Patient transported to X-ray 

## 2018-07-03 NOTE — ED Provider Notes (Signed)
[facility] Provider Note  ____________________________________________  Time seen: Approximately 9:54 PM  I have reviewed the triage vital signs and the nursing notes.   HISTORY  Chief Complaint Back Pain    HPI Darrell [facility] is a 47 y.o. male presents to the emergency [facility] with 10 out of 10 low back pain with right lower extremity radiculopathy that worsened acutely today.  Patient reports that he has experienced pain for the past 2 to 3 days.  Patient has tried Tylenol, which has not relieved his symptoms.  She reports that he cannot use anti-inflammatories due to a history of gastritis and diverticulitis.  Patient reports that he has had some dysuria for the past 2 days but no increased urinary frequency or hematuria.  He has never been diagnosed with a urinary tract infection in the past.  No falls or traumas.  Patient denies a history of heavy lifting at work.  No prior surgeries to the low back.  No alleviating measures have been attempted.   Past Medical History:  Diagnosis Date  . Anxiety   . Diverticulitis large intestine   . Frequent headaches   . Gout     Patient Active Problem List   Diagnosis Date Noted  . Nocturia 10/14/2017  . GERD (gastroesophageal reflux disease) 10/14/2017  . Colitis   . Preventative health care 10/01/2016  . GAD (generalized anxiety disorder) 08/20/2016  . Chronic maxillary sinusitis 01/23/2016  . Diverticulitis of colon 10/12/2013    Past Surgical History:  Procedure Laterality Date  . COLONOSCOPY WITH PROPOFOL N/A 11/20/2017   Procedure: COLONOSCOPY WITH PROPOFOL;  Surgeon: Wyline Mood, MD;  Location: [facility] ENDOSCOPY;  Service: Gastroenterology;  Laterality: N/A;  . EYE SURGERY Left    age 45    Prior to Admission medications   Medication Sig Start Date End Date Taking? Authorizing Provider  escitalopram (LEXAPRO) 10 MG tablet TAKE 1 TABLET (10 MG TOTAL) BY MOUTH DAILY. 05/10/17    Doreene Nest, NP  guaiFENesin-codeine 100-10 MG/5ML syrup Take 5 mLs by mouth every 6 (six) hours as needed for cough. 02/05/18   Minna Antis, MD  methocarbamol (ROBAXIN) 500 MG tablet Take 1 tablet (500 mg total) by mouth 3 (three) times daily as needed for up to 5 days. 07/03/18 07/08/18  Orvil Feil, PA-C  predniSONE (DELTASONE) 50 MG tablet Take one 50 mg tablet once daily for the next five days. 07/03/18   Orvil Feil, PA-C    Allergies Morphine and related  Family History  Problem Relation Age of Onset  . Hypertension Mother   . Hypertension Father   . Heart disease Father   . Diabetes Father   . Cancer Maternal Grandmother        breast    Social History Social History   Tobacco Use  . Smoking status: Never Smoker  . Smokeless tobacco: Never Used  Substance Use Topics  . Alcohol use: No  . Drug use: No     Review of Systems  Constitutional: No fever/chills Eyes: No visual changes. No discharge ENT: No upper respiratory complaints. Cardiovascular: no chest pain. Respiratory: no cough. No SOB. Gastrointestinal: No abdominal pain.  No nausea, no vomiting.  No diarrhea.  No constipation. Genitourinary: Negative for dysuria. No hematuria Musculoskeletal: Patient has low back pain.  Skin: Negative for rash, abrasions, lacerations, ecchymosis. Neurological: Negative for headaches, focal weakness or numbness.   ____________________________________________   PHYSICAL EXAM:  VITAL SIGNS: ED Triage Vitals  Enc Vitals Group     BP 07/03/18 1943 126/79     Pulse Rate 07/03/18 1943 83     Resp 07/03/18 1943 18     Temp 07/03/18 1943 97.6 F (36.4 C)     Temp Source 07/03/18 1943 Oral     SpO2 07/03/18 1943 97 %     Weight 07/03/18 1944 220 lb (99.8 kg)     Height 07/03/18 1944 6\' 1"  (1.854 m)     Head Circumference --      Peak Flow --      Pain Score 07/03/18 1949 10     Pain Loc --      Pain Edu? --      Excl. in GC? --       Constitutional: Alert and oriented. Well appearing and in no acute distress. Eyes: Conjunctivae are normal. PERRL. EOMI. Head: Atraumatic.  Cardiovascular: Normal rate, regular rhythm. Normal S1 and S2.  Good peripheral circulation. Respiratory: Normal respiratory effort without tachypnea or retractions. Lungs CTAB. Good air entry to the bases with no decreased or absent breath sounds. Gastrointestinal: Bowel sounds 4 quadrants. Soft and nontender to palpation. No guarding or rigidity. No palpable masses. No distention. No CVA tenderness. Musculoskeletal: Full range of motion to all extremities. No gross deformities appreciated.  Patient has 5 out of 5 strength in the lower extremities.  He does have weakness with resisted extension at the right great toe in comparison with the left great toe.  Positive straight leg raise, right. Neurologic:  Normal speech and language. No gross focal neurologic deficits are appreciated.  Skin:  Skin is warm, dry and intact. No rash noted. Psychiatric: Mood and affect are normal. Speech and behavior are normal. Patient exhibits appropriate insight and judgement.   ____________________________________________   LABS (all labs ordered are listed, but only abnormal results are displayed)  Labs Reviewed  URINALYSIS, COMPLETE (UACMP) WITH MICROSCOPIC - Abnormal; Notable for the following components:      Result Value   Color, Urine YELLOW (*)    APPearance CLEAR (*)    All other components within normal limits   ____________________________________________  EKG   ____________________________________________  RADIOLOGY I personally viewed and evaluated these images as part of my medical decision making, as well as reviewing the written report by the radiologist.    Dg Lumbar Spine 2-3 Views  Result Date: 07/03/2018 CLINICAL DATA:  RIGHT lower back pain since yesterday, numb feeling down RIGHT leg EXAM: LUMBAR SPINE - 2-3 VIEW COMPARISON:  CT  abdomen and pelvis 09/10/2017 FINDINGS: Six non-rib-bearing lumbar type vertebra. Vertebral body heights maintained without fracture or subluxation. Minimal scattered disc space narrowing. No obvious spondylolysis. SI joints preserved. Minimal broad-based levoconvex thoracolumbar scoliosis. IMPRESSION: No acute abnormalities. Minimal degenerative disc disease changes and levoconvex scoliosis of the thoracolumbar spine. Electronically Signed   By: Ulyses Southward M.D.   On: 07/03/2018 22:43    ____________________________________________    PROCEDURES  Procedure(s) performed:    Procedures    Medications  methylPREDNISolone sodium succinate (SOLU-MEDROL) 125 mg/2 mL injection 125 mg (125 mg Intramuscular Given 07/03/18 2227)  oxyCODONE-acetaminophen (PERCOCET/ROXICET) 5-325 MG per tablet 1 tablet (1 tablet Oral Given 07/03/18 2226)  ondansetron (ZOFRAN-ODT) disintegrating tablet 4 mg (4 mg Oral Given 07/03/18 2226)  ketorolac (TORADOL) 30 MG/ML injection 30 mg (30 mg Intramuscular Given 07/03/18 2305)  methocarbamol (ROBAXIN) tablet 1,000 mg (1,000 mg Oral Given 07/03/18 2305)     ____________________________________________   INITIAL IMPRESSION / ASSESSMENT  AND PLAN / ED COURSE  Pertinent labs & imaging results that were available during my care of the patient were reviewed by me and considered in my medical decision making (see chart for details).  Review of the [place] CSRS was performed in accordance of the [facility] prior to dispensing any controlled drugs.      Assessment and Plan:  Low Back Pain:  Patient presents to the emergency [facility] with low back pain with right lower extremity radiculopathy that worsened acutely today.  Patient's neurologic exam was reassuring.  Patient was given Solu-Medrol, Roxicet, Toradol and Robaxin in the emergency [facility].   X-ray examination revealed no bony abnormality.  Patient was discharged with prednisone and Robaxin.  He was given a referral to  neurosurgery and a work note was provided.  Vital signs are reassuring prior to discharge.  All patient questions were answered.    ____________________________________________  FINAL CLINICAL IMPRESSION(S) / ED DIAGNOSES  Final diagnoses:  Acute bilateral low back pain with right-sided sciatica      NEW MEDICATIONS STARTED DURING THIS VISIT:  ED Discharge Orders        Ordered    predniSONE (DELTASONE) 50 MG tablet     07/03/18 2330    methocarbamol (ROBAXIN) 500 MG tablet  3 times daily PRN     07/03/18 2330          This chart was dictated using voice recognition software/Dragon. Despite best efforts to proofread, errors can occur which can change the meaning. Any change was purely unintentional.    Orvil FeilWoods, [place] M, PA-C 07/03/18 2340    Dionne BucySiadecki, Sebastian, MD 07/04/18 [phone]

## 2018-07-03 NOTE — ED Triage Notes (Signed)
Patient reports having right lower back pain since yesterday, denies any type of injury.  Patient reports pain increases with deep breath and has numb feeling down right leg.

## 2018-07-03 NOTE — ED Notes (Signed)
Pt reports r side low back pain worse on movement and certain posistions  Started last pm worse today no urinary symptoms  Denies specific injury  -  Numbness off and on right leg numbness started this afternoon

## 2018-07-11 ENCOUNTER — Emergency Department: Payer: 59

## 2018-07-11 ENCOUNTER — Other Ambulatory Visit: Payer: Self-pay

## 2018-07-11 ENCOUNTER — Emergency Department
Admission: EM | Admit: 2018-07-11 | Discharge: 2018-07-11 | Disposition: A | Discharge status: Home / Self Care | Payer: 59 | Attending: Emergency Medicine | Admitting: Emergency Medicine

## 2018-07-11 DIAGNOSIS — Y999 Unspecified external cause status: Secondary | ICD-10-CM | POA: Diagnosis not present

## 2018-07-11 DIAGNOSIS — S3992XA Unspecified injury of lower back, initial encounter: Secondary | ICD-10-CM | POA: Diagnosis present

## 2018-07-11 DIAGNOSIS — Z79899 Other long term (current) drug therapy: Secondary | ICD-10-CM | POA: Diagnosis not present

## 2018-07-11 DIAGNOSIS — Y939 Activity, unspecified: Secondary | ICD-10-CM | POA: Diagnosis not present

## 2018-07-11 DIAGNOSIS — R1030 Lower abdominal pain, unspecified: Secondary | ICD-10-CM | POA: Insufficient documentation

## 2018-07-11 DIAGNOSIS — Y929 Unspecified place or not applicable: Secondary | ICD-10-CM | POA: Insufficient documentation

## 2018-07-11 DIAGNOSIS — S39012D Strain of muscle, fascia and tendon of lower back, subsequent encounter: Secondary | ICD-10-CM | POA: Insufficient documentation

## 2018-07-11 DIAGNOSIS — X58XXXD Exposure to other specified factors, subsequent encounter: Secondary | ICD-10-CM | POA: Diagnosis not present

## 2018-07-11 LAB — COMPREHENSIVE METABOLIC PANEL
ALBUMIN: 4 g/dL (ref 3.5–5.0)
ALT: 24 U/L (ref 0–44)
AST: 14 U/L — AB (ref 15–41)
Alkaline Phosphatase: 45 U/L (ref 38–126)
Anion gap: 7 (ref 5–15)
BILIRUBIN TOTAL: 0.8 mg/dL (ref 0.3–1.2)
BUN: 15 mg/dL (ref 6–20)
CO2: 29 mmol/L (ref 22–32)
Calcium: 8.9 mg/dL (ref 8.9–10.3)
Chloride: 102 mmol/L (ref 98–111)
Creatinine, Ser: 1.11 mg/dL (ref 0.61–1.24)
GFR calc Af Amer: >60 mL/min (ref >60)
GFR calc non Af Amer: >60 mL/min (ref >60)
GLUCOSE: 86 mg/dL (ref 70–99)
POTASSIUM: 3.9 mmol/L (ref 3.5–5.1)
Sodium: 138 mmol/L (ref 135–145)
TOTAL PROTEIN: 6.7 g/dL (ref 6.5–8.1)

## 2018-07-11 LAB — CBC
HEMATOCRIT: 43 % (ref 40.0–52.0)
Hemoglobin: 14.9 g/dL (ref 13.0–18.0)
MCH: 32.3 pg (ref 26.0–34.0)
MCHC: 34.8 g/dL (ref 32.0–36.0)
MCV: 92.8 fL (ref 80.0–100.0)
Platelets: 344 10*3/uL (ref 150–440)
RBC: 4.63 MIL/uL (ref 4.40–5.90)
RDW: 13.2 % (ref 11.5–14.5)
WBC: 11.9 10*3/uL — ABNORMAL HIGH (ref 3.8–10.6)

## 2018-07-11 LAB — URINALYSIS, COMPLETE (UACMP) WITH MICROSCOPIC
BACTERIA UA: NONE SEEN
Bilirubin Urine: NEGATIVE
Glucose, UA: NEGATIVE mg/dL
Hgb urine dipstick: NEGATIVE
Ketones, ur: NEGATIVE mg/dL
Leukocytes, UA: NEGATIVE
Nitrite: NEGATIVE
PH: 6 (ref 5.0–8.0)
Protein, ur: NEGATIVE mg/dL
SPECIFIC GRAVITY, URINE: 1.009 (ref 1.005–1.030)
SQUAMOUS EPITHELIAL / LPF: NONE SEEN (ref 0–5)
WBC, UA: NONE SEEN WBC/hpf (ref 0–5)

## 2018-07-11 LAB — LIPASE, BLOOD: Lipase: 53 U/L — ABNORMAL HIGH (ref 11–51)

## 2018-07-11 MED ORDER — AMOXICILLIN-POT CLAVULANATE 875-125 MG PO TABS: 1.0000 | ORAL_TABLET | Freq: Two times a day (BID) | ORAL | 0 refills | Status: AC

## 2018-07-11 MED ORDER — FENTANYL CITRATE (PF) 100 MCG/2ML IJ SOLN
100.0000 ug | Freq: Once | INTRAMUSCULAR | Status: AC
  Administered 2018-07-11: 100 ug via INTRAMUSCULAR

## 2018-07-11 MED ORDER — FENTANYL CITRATE (PF) 100 MCG/2ML IJ SOLN
INTRAMUSCULAR | Status: AC
  Administered 2018-07-11: 100 ug via INTRAMUSCULAR
  Filled 2018-07-11: qty 2

## 2018-07-11 MED ORDER — HYDROCODONE-ACETAMINOPHEN 5-325 MG PO TABS: 1.0000 | ORAL_TABLET | ORAL | 0 refills | Status: DC | PRN

## 2018-07-11 NOTE — ED Notes (Signed)
Patient transported to CT 

## 2018-07-11 NOTE — ED Triage Notes (Signed)
Pt arrived via POV with reports of central low back pain and LLQ abd pain for the past few days. Pt states he was seen here last week for back pain, but states the pain is different today and is now in his abdomen.  Pt states back pain is 8/10 and abdominal pain 7/10.  Pt reports nausea, no vomiting or diarrhea.

## 2018-07-11 NOTE — ED Provider Notes (Addendum)
[facility] Emergency Department Provider Note  Time seen: 2:11 PM  I have reviewed the triage vital signs and the nursing notes.   HISTORY  Chief Complaint Back Pain and Abdominal Pain    HPI Darrell [facility] is a 47 y.o. male with a past medical history of anxiety, diverticulitis, presents to the emergency department for back pain and left lower quadrant abdominal pain.  According to the patient he has been experiencing back pain over the past week or so was seen in the emergency department approximately 1 week ago for the same.  Describes a central lower back pain, worse with movement or if he sits up quickly.  Over the past 2 to 3 days he states he is also been experiencing pain into his left lower abdomen.  Denies any dysuria or hematuria.  No diarrhea or vomiting.  No fever.  Describes the pain is an 8/10 in the lower back and left lower quadrant of the abdomen.   Past Medical History:  Diagnosis Date  . Anxiety   . Diverticulitis large intestine   . Frequent headaches   . Gout     Patient Active Problem List   Diagnosis Date Noted  . Nocturia 10/14/2017  . GERD (gastroesophageal reflux disease) 10/14/2017  . Colitis   . Preventative health care 10/01/2016  . GAD (generalized anxiety disorder) 08/20/2016  . Chronic maxillary sinusitis 01/23/2016  . Diverticulitis of colon 10/12/2013    Past Surgical History:  Procedure Laterality Date  . COLONOSCOPY WITH PROPOFOL N/A 11/20/2017   Procedure: COLONOSCOPY WITH PROPOFOL;  Surgeon: Wyline MoodAnna, Kiran, MD;  Location[facility];  Service: Gastroenterology;  Laterality: N/A;  . EYE SURGERY Left    age 89    Prior to Admission medications   Medication Sig Start Date End Date Taking? Authorizing Provider  escitalopram (LEXAPRO) 10 MG tablet TAKE 1 TABLET (10 MG TOTAL) BY MOUTH DAILY. 05/10/17   Doreene Nestlark, Katherine K, NP  guaiFENesin-codeine 100-10 MG/5ML syrup Take 5 mLs by mouth every 6 (six) hours as needed  for cough. 02/05/18   Minna AntisPaduchowski, Jauan Wohl, MD  predniSONE (DELTASONE) 50 MG tablet Take one 50 mg tablet once daily for the next five days. 07/03/18   Orvil FeilWoods, Jaclyn M, PA-C    Allergies  Allergen Reactions  . Morphine And Related     Patient believes that possibly in the past morphine made him itchy.    Family History  Problem Relation Age of Onset  . Hypertension Mother   . Hypertension Father   . Heart disease Father   . Diabetes Father   . Cancer Maternal Grandmother        breast    Social History Social History   Tobacco Use  . Smoking status: Never Smoker  . Smokeless tobacco: Never Used  Substance Use Topics  . Alcohol use: No  . Drug use: No    Review of Systems Constitutional: Negative for fever Cardiovascular: Negative for chest pain. Respiratory: Negative for shortness of breath. Gastrointestinal: Left lower quadrant abdominal pain negative for nausea vomiting or diarrhea Genitourinary: Negative for urinary compaints Musculoskeletal: Central lower back pain Skin: Negative for skin complaints  Neurological: Negative for headache All other ROS negative  ____________________________________________   PHYSICAL EXAM:  VITAL SIGNS: ED Triage Vitals  Enc Vitals Group     BP 07/11/18 1045 134/81     Pulse Rate 07/11/18 1045 72     Resp 07/11/18 1045 18     Temp 07/11/18 1045  98.1 F (36.7 C)     Temp Source 07/11/18 1045 Oral     SpO2 07/11/18 1045 97 %     Weight 07/11/18 1045 217 lb (98.4 kg)     Height 07/11/18 1045 6\' 1"  (1.854 m)     Head Circumference --      Peak Flow --      Pain Score 07/11/18 1123 8     Pain Loc --      Pain Edu? --      Excl. in GC? --     Constitutional: Alert and oriented. Well appearing and in no distress. Eyes: Normal exam ENT   Head: Normocephalic and atraumatic   Mouth/Throat: Mucous membranes are moist. Cardiovascular: Normal rate, regular rhythm.  Respiratory: Normal respiratory effort without tachypnea  nor retractions. Breath sounds are clear  Gastrointestinal: Mild tenderness across left lower quadrant suprapubic region.  No rebound guarding or distention.  No CVA tenderness. Musculoskeletal: Nontender with normal range of motion in all extremities.  Mild central lower lumbar tenderness. Neurologic:  Normal speech and language. No gross focal neurologic deficits are appreciated. Skin:  Skin is warm, dry and intact.  Psychiatric: Mood and affect are normal. Speech and behavior are normal.   ____________________________________________    RADIOLOGY  CT scan is largely negative no acute abnormality in abdomen or pelvis.  There is a fatty lesion in the duodenum possible lipoma/polyp.  ____________________________________________   INITIAL IMPRESSION / ASSESSMENT AND PLAN / ED COURSE  Pertinent labs & imaging results that were available during my care of the patient were reviewed by me and considered in my medical decision making (see chart for details).  Patient presents to the emergency department for lower back pain ongoing greater than 1 week and now with several days of left lower quadrant abdominal pain.  Differential would include diverticulitis, colitis, ureterolithiasis, urinary tract infection, muscular skeletal pain.  Patient's labs are largely within normal limits very very slight elevation in lipase however patient has no epigastric or upper abdominal pain.  Urinalysis is normal.  We will obtain a CT scan of the abdomen/pelvis to further evaluate help to evaluate specifically for ureterolithiasis and possibly diverticulitis.  Patient agreeable to plan of care.  CT scan largely negative for acute abnormality.  There is a small fatty lesion in the duodenum possible lipoma versus polyp.  We will refer to GI medicine for nonemergent work-up.  Overall the patient appears very well.  We will discharge with a short course of pain medication have the patient follow-up with his primary  care doctor.  I reviewed the patient in the narcotic database appears to be low risk.  She does have a history of diverticulitis, states she was hospitalized approximate 1 year ago for diverticulitis.  States he feels somewhat similar in the lower abdomen.  As a precaution we will cover with Augmentin.  I discussed with the patient if his symptoms significantly worsen over the next several days or he develops a fever he should return to the emergency department for a contrasted CT study.  Otherwise he will follow-up with his doctor.  I discussed the polyp with the patient he states he Artie has a GI doctor and will follow up with them.  ____________________________________________   FINAL CLINICAL IMPRESSION(S) / ED DIAGNOSES  Left lower quadrant abdominal pain Back pain    Minna Antis, MD 07/11/18 1458    Minna Antis, MD 07/11/18 [phone]

## 2018-07-11 NOTE — ED Notes (Signed)
Track pad not working. Pt verbally understood discharge medications and instructions.

## 2018-07-28 ENCOUNTER — Ambulatory Visit: Payer: 59 | Admitting: Gastroenterology

## 2018-09-08 ENCOUNTER — Ambulatory Visit: Payer: 59 | Admitting: Gastroenterology

## 2018-09-16 ENCOUNTER — Ambulatory Visit (INDEPENDENT_AMBULATORY_CARE_PROVIDER_SITE_OTHER): Payer: 59 | Admitting: Family Medicine

## 2018-09-16 ENCOUNTER — Ambulatory Visit: Payer: Self-pay | Admitting: *Deleted

## 2018-09-16 VITALS — BP 126/80 | HR 83 | Temp 98.2°F | Ht 73.0 in | Wt 218.5 lb

## 2018-09-16 DIAGNOSIS — R350 Frequency of micturition: Secondary | ICD-10-CM

## 2018-09-16 DIAGNOSIS — J019 Acute sinusitis, unspecified: Secondary | ICD-10-CM | POA: Insufficient documentation

## 2018-09-16 DIAGNOSIS — J0100 Acute maxillary sinusitis, unspecified: Secondary | ICD-10-CM | POA: Diagnosis not present

## 2018-09-16 DIAGNOSIS — R5383 Other fatigue: Secondary | ICD-10-CM | POA: Insufficient documentation

## 2018-09-16 DIAGNOSIS — R432 Parageusia: Secondary | ICD-10-CM | POA: Diagnosis not present

## 2018-09-16 LAB — CBC WITH DIFFERENTIAL/PLATELET
BASOS ABS: 0 10*3/uL (ref 0.0–0.1)
Basophils Relative: 0.3 % (ref 0.0–3.0)
Eosinophils Absolute: 0 10*3/uL (ref 0.0–0.7)
Eosinophils Relative: 0.1 % (ref 0.0–5.0)
HCT: 40.6 % (ref 39.0–52.0)
HEMOGLOBIN: 13.7 g/dL (ref 13.0–17.0)
LYMPHS ABS: 2.6 10*3/uL (ref 0.7–4.0)
Lymphocytes Relative: 27.9 % (ref 12.0–46.0)
MCHC: 33.9 g/dL (ref 30.0–36.0)
MCV: 93.7 fl (ref 78.0–100.0)
Monocytes Absolute: 0.7 10*3/uL (ref 0.1–1.0)
Monocytes Relative: 7.5 % (ref 3.0–12.0)
NEUTROS PCT: 64.2 % (ref 43.0–77.0)
Neutro Abs: 6 10*3/uL (ref 1.4–7.7)
Platelets: 345 10*3/uL (ref 150.0–400.0)
RBC: 4.33 Mil/uL (ref 4.22–5.81)
RDW: 12.9 % (ref 11.5–15.5)
WBC: 9.3 10*3/uL (ref 4.0–10.5)

## 2018-09-16 LAB — BASIC METABOLIC PANEL
BUN: 11 mg/dL (ref 6–23)
CALCIUM: 9.5 mg/dL (ref 8.4–10.5)
CO2: 29 mEq/L (ref 19–32)
Chloride: 101 mEq/L (ref 96–112)
Creatinine, Ser: 1 mg/dL (ref 0.40–1.50)
GFR: 84.94 mL/min (ref >60.00)
GLUCOSE: 83 mg/dL (ref 70–99)
Potassium: 3.8 mEq/L (ref 3.5–5.1)
Sodium: 140 mEq/L (ref 135–145)

## 2018-09-16 LAB — POC URINALSYSI DIPSTICK (AUTOMATED)
Bilirubin, UA: NEGATIVE
Blood, UA: NEGATIVE
Glucose, UA: NEGATIVE
KETONES UA: NEGATIVE
Leukocytes, UA: NEGATIVE
Nitrite, UA: NEGATIVE
PH UA: 6 (ref 5.0–8.0)
PROTEIN UA: NEGATIVE
SPEC GRAV UA: 1.025 (ref 1.010–1.025)
UROBILINOGEN UA: 0.2 U/dL

## 2018-09-16 LAB — VITAMIN B12: Vitamin B-12: 194 pg/mL — ABNORMAL LOW (ref 211–911)

## 2018-09-16 LAB — VITAMIN D 25 HYDROXY (VIT D DEFICIENCY, FRACTURES): VITD: 31.83 ng/mL (ref 30.00–100.00)

## 2018-09-16 LAB — TSH: TSH: 1.7 u[IU]/mL (ref 0.35–4.50)

## 2018-09-16 MED ORDER — AMOXICILLIN-POT CLAVULANATE 875-125 MG PO TABS: 1.0000 | ORAL_TABLET | Freq: Two times a day (BID) | ORAL | 0 refills | Status: AC

## 2018-09-16 NOTE — Patient Instructions (Addendum)
I think you may have sinus infection - treat with augmentin antibiotic for 10 days. If no improvement, return for labwork (call us with update).  Push fluids and rest.  May use tylenol for discomfort.

## 2018-09-16 NOTE — Progress Notes (Signed)
BP 126/80 (BP Location: Left Arm, Patient Position: Sitting, Cuff Size: Normal)   Pulse 83   Temp 98.2 F (36.8 C) (Oral)   Ht 6\' 1"  (1.854 m)   Wt 218 lb 8 oz (99.1 kg)   SpO2 98%   BMI 28.83 kg/m   Orthostatic VS for the past 24 hrs (Last 3 readings):  BP- Lying BP- Standing at 0 minutes  09/16/18 1112 - 140/82  09/16/18 1110 130/90 -    CC: several concerns Subjective:    Patient ID: Darrell [facility], male    DOB: [birth year], 47 y.o.   MRN: [id number]  HPI: Darrell [facility] is a 47 y.o. male presenting on 09/16/2018 for Arm Pain (C/o bilateral cramping arm pain and occasional dizziness. Started about 2 days ago. ); Urinary Frequency (C/o urinary frequency that started about 4 days ago. ); and Bad taste (C/o of salty taste after eating anything. )   Several week h/o cold symptoms - congestion, cough. Persistent congestion since. 3-4d h/o cramping in chest and upper arms and paresthesias of arms. Increased urinary frequency. Increased fatigue, lack of energy, anything he eats or drinks tastes salty. Dizzy spell last night while sitting in recliner watching TV - described as "room spinning" that lasted seconds. Some L earache, ringing for several weeks. No hearing changes. Throat staying sore, mild PNdrainage, mild HA.   No fevers/chills, tooth pain, dyspnea or wheezing. No abd pain, diarrhea, vomiting. Nauseated once earlier this week.  No recent tick or other bug bites. No rashes.   Avoids caffeine. Good water intake. Did try mucinex initially.  Off all meds. Intermittently takes apple cider pills.  lexapro stopped 1 yr ago.   Relevant past medical, surgical, family and social history reviewed and updated as indicated. Interim medical history since our last visit reviewed. Allergies and medications reviewed and updated. Outpatient Medications Prior to Visit  Medication Sig Dispense Refill  . escitalopram (LEXAPRO) 10 MG tablet TAKE 1 TABLET (10 MG TOTAL) BY MOUTH DAILY. 90 tablet  1  . guaiFENesin-codeine 100-10 MG/5ML syrup Take 5 mLs by mouth every 6 (six) hours as needed for cough. 120 mL 0  . HYDROcodone-acetaminophen (NORCO/VICODIN) 5-325 MG tablet Take 1 tablet by mouth every 4 (four) hours as needed. 15 tablet 0  . predniSONE (DELTASONE) 50 MG tablet Take one 50 mg tablet once daily for the next five days. 5 tablet 0   No facility-administered medications prior to visit.      Per HPI unless specifically indicated in ROS section below Review of Systems     Objective:    BP 126/80 (BP Location: Left Arm, Patient Position: Sitting, Cuff Size: Normal)   Pulse 83   Temp 98.2 F (36.8 C) (Oral)   Ht 6\' 1"  (1.854 m)   Wt 218 lb 8 oz (99.1 kg)   SpO2 98%   BMI 28.83 kg/m   Wt Readings from Last 3 Encounters:  09/16/18 218 lb 8 oz (99.1 kg)  07/11/18 217 lb (98.4 kg)  07/03/18 220 lb (99.8 kg)    Physical Exam  Constitutional: He appears well-developed and well-nourished. No distress.  HENT:  Head: Normocephalic and atraumatic.  Right Ear: Hearing, tympanic membrane, external ear and ear canal normal.  Left Ear: Hearing, external ear and ear canal normal.  Nose: Mucosal edema (nasal mucosal congestion) present. No rhinorrhea. Right sinus exhibits no maxillary sinus tenderness and no frontal sinus tenderness. Left sinus exhibits maxillary sinus tenderness. Left sinus exhibits no frontal  sinus tenderness.  Mouth/Throat: Uvula is midline, oropharynx is clear and moist and mucous membranes are normal. No oropharyngeal exudate, posterior oropharyngeal edema, posterior oropharyngeal erythema or tonsillar abscesses.  Retracted TM on left  Eyes: Pupils are equal, round, and reactive to light. Conjunctivae and EOM are normal. No scleral icterus.  Neck: Normal range of motion. Neck supple.  Cardiovascular: Normal rate, regular rhythm, normal heart sounds and intact distal pulses.  No murmur heard. Pulmonary/Chest: Effort normal and breath sounds normal. No  respiratory distress. He has no wheezes. He has no rales. He exhibits no tenderness.  No reproducible chest wall pain  Lymphadenopathy:    He has no cervical adenopathy.  Skin: Skin is warm and dry. No rash noted.  Nursing note and vitals reviewed.  Results for orders placed or performed during the hospital encounter of 07/11/18  Lipase, blood  Result Value Ref Range   Lipase 53 (H) 11 - 51 U/L  Comprehensive metabolic panel  Result Value Ref Range   Sodium 138 135 - 145 mmol/L   Potassium 3.9 3.5 - 5.1 mmol/L   Chloride 102 98 - 111 mmol/L   CO2 29 22 - 32 mmol/L   Glucose, Bld 86 70 - 99 mg/dL   BUN 15 6 - 20 mg/dL   Creatinine, Ser 1.61 0.61 - 1.24 mg/dL   Calcium 8.9 8.9 - 09.6 mg/dL   Total Protein 6.7 6.5 - 8.1 g/dL   Albumin 4.0 3.5 - 5.0 g/dL   AST 14 (L) 15 - 41 U/L   ALT 24 0 - 44 U/L   Alkaline Phosphatase 45 38 - 126 U/L   Total Bilirubin 0.8 0.3 - 1.2 mg/dL   GFR calc non Af Amer >60 >60 mL/min   GFR calc Af Amer >60 >60 mL/min   Anion gap 7 5 - 15  CBC  Result Value Ref Range   WBC 11.9 (H) 3.8 - 10.6 K/uL   RBC 4.63 4.40 - 5.90 MIL/uL   Hemoglobin 14.9 13.0 - 18.0 g/dL   HCT 04.5 40.9 - 81.1 %   MCV 92.8 80.0 - 100.0 fL   MCH 32.3 26.0 - 34.0 pg   MCHC 34.8 32.0 - 36.0 g/dL   RDW 91.4 78.2 - 95.6 %   Platelets 344 150 - 440 K/uL  Urinalysis, Complete w Microscopic  Result Value Ref Range   Color, Urine STRAW (A) YELLOW   APPearance CLEAR (A) CLEAR   Specific Gravity, Urine 1.009 1.005 - 1.030   pH 6.0 5.0 - 8.0   Glucose, UA NEGATIVE NEGATIVE mg/dL   Hgb urine dipstick NEGATIVE NEGATIVE   Bilirubin Urine NEGATIVE NEGATIVE   Ketones, ur NEGATIVE NEGATIVE mg/dL   Protein, ur NEGATIVE NEGATIVE mg/dL   Nitrite NEGATIVE NEGATIVE   Leukocytes, UA NEGATIVE NEGATIVE   RBC / HPF 0-5 0 - 5 RBC/hpf   WBC, UA NONE SEEN 0 - 5 WBC/hpf   Bacteria, UA NONE SEEN NONE SEEN   Squamous Epithelial / LPF NONE SEEN 0 - 5      Assessment & Plan:   Problem List  Items Addressed This Visit    Fatigue    Patient remains very concerned about other symptoms despite my reassurance this could be sinus related (fatigue, malaise, salty dysgeusia, paresthesias, cramping, etc). Will check labs today.       Relevant Orders   Basic metabolic panel   CBC with Differential/Platelet   TSH   Vitamin B12   VITAMIN D 25 Hydroxy (  Vit-D Deficiency, Fractures)   Zinc   Acute sinusitis - Primary    Some symptoms suspicious for sinusitis (R maxillary pain, retracted TM, vertigo symptoms, ongoing congestion for weeks) - treat with augmentin course. Update with effect.      Relevant Medications   amoxicillin-clavulanate (AUGMENTIN) 875-125 MG tablet    Other Visit Diagnoses    Dysgeusia       Relevant Orders   Zinc       Meds ordered this encounter  Medications  . amoxicillin-clavulanate (AUGMENTIN) 875-125 MG tablet    Sig: Take 1 tablet by mouth 2 (two) times daily for 10 days.    Dispense:  20 tablet    Refill:  0   Orders Placed This Encounter  Procedures  . Basic metabolic panel  . CBC with Differential/Platelet  . TSH  . Vitamin B12  . VITAMIN D 25 Hydroxy (Vit-D Deficiency, Fractures)  . Zinc    Follow up plan: Return if symptoms worsen or fail to improve.  Eustaquio Boyden, MD

## 2018-09-16 NOTE — Assessment & Plan Note (Signed)
Some symptoms suspicious for sinusitis (R maxillary pain, retracted TM, vertigo symptoms, ongoing congestion for weeks) - treat with augmentin course. Update with effect.

## 2018-09-16 NOTE — Addendum Note (Signed)
Addended by: Nanci Pina on: 09/16/2018 12:13 PM   Modules accepted: Orders

## 2018-09-16 NOTE — Telephone Encounter (Signed)
Pt called with complaints of both arm pain(cramping of arms and chest area), and everything he eats and drinks taste like salt; he said that last pm he was up urinating about 10 times; he is also experiencing dizziness that started 09/15/18; recommendations made per nurse triage protocol; the pt normally sees Vernona Rieger but she has no availability; pt offered and accepted appointment with Dr Sharen Hones, [facility], 09/16/18 at 1115; he verbalizes understanding; will route to office for notification.  Reason for Disposition . [1] MODERATE dizziness (e.g., vertigo; feels very unsteady, interferes with normal activities) AND [2] has NOT been evaluated by physician for this  Answer Assessment - Initial Assessment Questions 1. DESCRIPTION: "Describe your dizziness."     Room is spinning 2. VERTIGO: "Do you feel like either you or the room is spinning or tilting?"      yes 3. LIGHTHEADED: "Do you feel lightheaded?" (e.g., somewhat faint, woozy, weak upon standing)     no 4. SEVERITY: "How bad is it?"  "Can you walk?"   - MILD - Feels unsteady but walking normally.   - MODERATE - Feels very unsteady when walking, but not falling; interferes with normal activities (e.g., school, work) .   - SEVERE - Unable to walk without falling (requires assistance).     moderate 5. ONSET:  "When did the dizziness begin?"     09/15/18 at 2030 6. AGGRAVATING FACTORS: "Does anything make it worse?" (e.g., standing, change in head position)     no 7. CAUSE: "What do you think is causing the dizziness?"     No idea 8. RECURRENT SYMPTOM: "Have you had dizziness before?" If so, ask: "When was the last time?" "What happened that time?"     Yes with head cold, inner ear 9. OTHER SYMPTOMS: "Do you have any other symptoms?" (e.g., headache, weakness, numbness, vomiting, earache)     cramping in both arms and top of chest 10. PREGNANCY: "Is there any chance you are pregnant?" "When was your last menstrual  period?"       n/a  Protocols used: DIZZINESS - VERTIGO-A-AH

## 2018-09-16 NOTE — Assessment & Plan Note (Signed)
Patient remains very concerned about other symptoms despite my reassurance this could be sinus related (fatigue, malaise, salty dysgeusia, paresthesias, cramping, etc). Will check labs today.

## 2018-09-17 ENCOUNTER — Other Ambulatory Visit: Payer: Self-pay | Admitting: Family Medicine

## 2018-09-17 DIAGNOSIS — E538 Deficiency of other specified B group vitamins: Secondary | ICD-10-CM

## 2018-09-17 MED ORDER — B-12 1000 MCG SL SUBL: 1.0000 | SUBLINGUAL_TABLET | Freq: Every day | SUBLINGUAL | Status: DC

## 2018-09-18 LAB — ZINC: ZINC: 59 ug/dL — AB (ref 60–130)

## 2018-09-29 ENCOUNTER — Ambulatory Visit: Payer: 59 | Admitting: Internal Medicine

## 2018-09-29 ENCOUNTER — Encounter: Payer: Self-pay | Admitting: Internal Medicine

## 2018-09-29 VITALS — BP 138/86 | HR 74 | Temp 98.4°F | Wt 216.0 lb

## 2018-09-29 DIAGNOSIS — Z23 Encounter for immunization: Secondary | ICD-10-CM | POA: Diagnosis not present

## 2018-09-29 DIAGNOSIS — R252 Cramp and spasm: Secondary | ICD-10-CM

## 2018-09-29 DIAGNOSIS — I10 Essential (primary) hypertension: Secondary | ICD-10-CM | POA: Diagnosis not present

## 2018-09-29 DIAGNOSIS — R51 Headache: Secondary | ICD-10-CM | POA: Diagnosis not present

## 2018-09-29 DIAGNOSIS — R42 Dizziness and giddiness: Secondary | ICD-10-CM | POA: Diagnosis not present

## 2018-09-29 DIAGNOSIS — R519 Headache, unspecified: Secondary | ICD-10-CM

## 2018-09-29 DIAGNOSIS — R0602 Shortness of breath: Secondary | ICD-10-CM

## 2018-09-29 MED ORDER — AMLODIPINE BESYLATE 5 MG PO TABS: 5.0000 mg | ORAL_TABLET | Freq: Every day | ORAL | 0 refills | Status: DC

## 2018-09-29 NOTE — Progress Notes (Signed)
Subjective:    Patient ID: Darrell Lawson, male    DOB: June 18, 1971, 47 y.o.   MRN: [id number]  HPI  Pt presents to the clinic today with c/o elevated blood pressure. His blood pressure typically runs 120-130/80's. He reports his BP at home has been 144/91, 154/94 at home. He has had associated headache, dizziness, muscle cramping in the chest and intermittent shortness of breath.  He describes the headache as located in the temples.  He describes the pain as throbbing.  He denies visual changes with it.  He describes the dizziness as a sense of lightheadedness not but the room is spinning.  He denies syncope or near syncope.  He denies chest pain or chest tightness.  He reports he was seen 1 week ago for similar issues by Dr. Sharen Hones.  He reports he was treated for a sinus infection with antibiotics, but he reports no improvement in his symptoms.  He has never been diagnosed with hypertension in the past.  He is not currently taking any antihypertensive medications.  Review of Systems      Past Medical History:  Diagnosis Date  . Anxiety   . Diverticulitis large intestine   . Frequent headaches   . Gout     Current Outpatient Medications  Medication Sig Dispense Refill  . Cyanocobalamin (B-12) 1000 MCG SUBL Place 1 tablet under the tongue daily. 30 each    No current facility-administered medications for this visit.     Allergies  Allergen Reactions  . Morphine And Related     Patient believes that possibly in the past morphine made him itchy.    Family History  Problem Relation Age of Onset  . Hypertension Mother   . Hypertension Father   . Heart disease Father   . Diabetes Father   . Cancer Maternal Grandmother        breast    Social History   Socioeconomic History  . Marital status: Married    Spouse name: Not on file  . Number of children: Not on file  . Years of education: Not on file  . Highest education level: Not on file  Occupational History   Employer: [employer]  Social Needs  . Financial resource strain: Not on file  . Food insecurity:    Worry: Not on file    Inability: Not on file  . Transportation needs:    Medical: Not on file    Non-medical: Not on file  Tobacco Use  . Smoking status: Never Smoker  . Smokeless tobacco: Never Used  Substance and Sexual Activity  . Alcohol use: No  . Drug use: No  . Sexual activity: Yes  Lifestyle  . Physical activity:    Days per week: Not on file    Minutes per session: Not on file  . Stress: Not on file  Relationships  . Social connections:    Talks on phone: Not on file    Gets together: Not on file    Attends religious service: Not on file    Active member of club or organization: Not on file    Attends meetings of clubs or organizations: Not on file    Relationship status: Not on file  . Intimate partner violence:    Fear of current or ex partner: Not on file    Emotionally abused: Not on file    Physically abused: Not on file    Forced sexual activity: Not on file  Other  Topics Concern  . Not on file  Social History Narrative  . Not on file     Constitutional: Patient reports headache.  Denies fever, malaise, fatigue, or abrupt weight changes.  HEENT: Denies eye pain, eye redness, ear pain, ringing in the ears, wax buildup, runny nose, nasal congestion, bloody nose, or sore throat. Respiratory: Patient reports intermittent shortness of breath.  Denies difficulty breathing, cough or sputum production.   Cardiovascular: Denies chest pain, chest tightness, palpitations or swelling in the hands or feet.  Gastrointestinal: Denies abdominal pain, bloating, constipation, diarrhea or blood in the stool.  Musculoskeletal: Patient reports muscle cramping of chest.  Denies decrease in range of motion, difficulty with gait,  or joint pain and swelling.  Skin: Denies redness, rashes, lesions or ulcercations.  Neurological: Patient reports intermittent dizziness.   Denies difficulty with memory, difficulty with speech or problems with balance and coordination.  Psych: Denies anxiety, depression, SI/HI.  No other specific complaints in a complete review of systems (except as listed in HPI above).  Objective:   Physical Exam  BP 138/86   Pulse 74   Temp 98.4 F (36.9 C) (Oral)   Wt 216 lb (98 kg)   SpO2 98%   BMI 28.50 kg/m  Wt Readings from Last 3 Encounters:  09/29/18 216 lb (98 kg)  09/16/18 218 lb 8 oz (99.1 kg)  07/11/18 217 lb (98.4 kg)    General: Appears his stated age, well developed, well nourished in NAD. Skin: Warm, dry and intact. No rashesnoted. Neck:  Neck supple, trachea midline. No masses, lumps or thyromegaly present.  Cardiovascular: Normal rate and rhythm. S1,S2 noted.  No murmur, rubs or gallops noted. No JVD or BLE edema. No carotid bruits noted. Pulmonary/Chest: Normal effort and positive vesicular breath sounds. No respiratory distress. No wheezes, rales or ronchi noted.  Musculoskeletal: Chest wall nontender with palpation. Neurological: Alert and oriented. Coordination normal.  Psychiatric: Mood and affect normal. Behavior is normal. Judgment and thought content normal.   BMET    Component Value Date/Time   NA 140 09/16/2018 1200   NA 136 08/28/2012 1534   K 3.8 09/16/2018 1200   K 3.9 08/28/2012 1534   CL 101 09/16/2018 1200   CL 100 08/28/2012 1534   CO2 29 09/16/2018 1200   CO2 28 08/28/2012 1534   GLUCOSE 83 09/16/2018 1200   GLUCOSE 119 (H) 08/28/2012 1534   BUN 11 09/16/2018 1200   BUN 9 08/28/2012 1534   CREATININE 1.00 09/16/2018 1200   CREATININE 1.13 08/28/2012 1534   CALCIUM 9.5 09/16/2018 1200   CALCIUM 9.6 08/28/2012 1534   GFRNONAA >60 07/11/2018 1046   GFRNONAA >60 08/28/2012 1534   GFRAA >60 07/11/2018 1046   GFRAA >60 08/28/2012 1534    Lipid Panel     Component Value Date/Time   CHOL 174 10/07/2017 0830   TRIG 89.0 10/07/2017 0830   HDL 46.80 10/07/2017 0830   CHOLHDL 4  10/07/2017 0830   VLDL 17.8 10/07/2017 0830   LDLCALC 109 (H) 10/07/2017 0830    CBC    Component Value Date/Time   WBC 9.3 09/16/2018 1200   RBC 4.33 09/16/2018 1200   HGB 13.7 09/16/2018 1200   HGB 14.4 08/28/2012 1534   HCT 40.6 09/16/2018 1200   HCT 42.2 08/28/2012 1534   PLT 345.0 09/16/2018 1200   PLT 323 08/28/2012 1534   MCV 93.7 09/16/2018 1200   MCV 91 08/28/2012 1534   MCH 32.3 07/11/2018 1046  MCHC 33.9 09/16/2018 1200   RDW 12.9 09/16/2018 1200   RDW 13.0 08/28/2012 1534   LYMPHSABS 2.6 09/16/2018 1200   MONOABS 0.7 09/16/2018 1200   EOSABS 0.0 09/16/2018 1200   BASOSABS 0.0 09/16/2018 1200    Hgb A1C Lab Results  Component Value Date   HGBA1C 5.6 10/07/2017            Assessment & Plan:   HTN, Headache, Dizziness, Muscle Cramping of Chest, Intermittent SOB:  Exam benign ECG reviewed, no need to repeat today Had multiple labs by Dr. Reece Agar 1 week ago- reviewed He denies feeling stress, anxious or depressed Will trail Amlodipine 5 mg daily, discussed possibility of swelling Discussed DASH diet If symptoms persist, consider cards referral for stress test  Follow up with PCP in 1 week Nicki Reaper, NP

## 2018-10-02 ENCOUNTER — Encounter: Payer: Self-pay | Admitting: Internal Medicine

## 2018-10-02 NOTE — Patient Instructions (Signed)

## 2018-10-19 ENCOUNTER — Ambulatory Visit: Payer: 59 | Admitting: Primary Care

## 2018-10-20 ENCOUNTER — Encounter: Payer: Self-pay | Admitting: Primary Care

## 2018-10-20 ENCOUNTER — Other Ambulatory Visit: Payer: Self-pay | Admitting: Primary Care

## 2018-10-20 ENCOUNTER — Ambulatory Visit (INDEPENDENT_AMBULATORY_CARE_PROVIDER_SITE_OTHER): Payer: 59 | Admitting: Primary Care

## 2018-10-20 VITALS — BP 120/78 | HR 82 | Temp 98.0°F | Ht 73.0 in | Wt 218.0 lb

## 2018-10-20 DIAGNOSIS — R5383 Other fatigue: Secondary | ICD-10-CM

## 2018-10-20 DIAGNOSIS — I10 Essential (primary) hypertension: Secondary | ICD-10-CM

## 2018-10-20 DIAGNOSIS — F411 Generalized anxiety disorder: Secondary | ICD-10-CM | POA: Diagnosis not present

## 2018-10-20 DIAGNOSIS — F5101 Primary insomnia: Secondary | ICD-10-CM

## 2018-10-20 DIAGNOSIS — K5732 Diverticulitis of large intestine without perforation or abscess without bleeding: Secondary | ICD-10-CM | POA: Diagnosis not present

## 2018-10-20 DIAGNOSIS — Z0000 Encounter for general adult medical examination without abnormal findings: Secondary | ICD-10-CM

## 2018-10-20 DIAGNOSIS — G4700 Insomnia, unspecified: Secondary | ICD-10-CM | POA: Insufficient documentation

## 2018-10-20 DIAGNOSIS — E538 Deficiency of other specified B group vitamins: Secondary | ICD-10-CM | POA: Diagnosis not present

## 2018-10-20 LAB — LIPID PANEL
CHOLESTEROL: 175 mg/dL (ref 0–200)
HDL: 58.2 mg/dL (ref >39.00)
LDL Cholesterol: 102 mg/dL — ABNORMAL HIGH (ref 0–99)
NonHDL: 116.8
Total CHOL/HDL Ratio: 3
Triglycerides: 74 mg/dL (ref 0.0–149.0)
VLDL: 14.8 mg/dL (ref 0.0–40.0)

## 2018-10-20 LAB — VITAMIN B12: VITAMIN B 12: 187 pg/mL — AB (ref 211–911)

## 2018-10-20 MED ORDER — AMLODIPINE BESYLATE 5 MG PO TABS: 5.0000 mg | ORAL_TABLET | Freq: Every day | ORAL | 3 refills | Status: DC

## 2018-10-20 MED ORDER — SERTRALINE HCL 50 MG PO TABS: 50.0000 mg | ORAL_TABLET | Freq: Every day | ORAL | 0 refills | Status: DC

## 2018-10-20 NOTE — Assessment & Plan Note (Addendum)
Difficulty falling asleep, also gets up during the night.  Will have him start Zoloft for treatment of uncontrolled anxiety, start Melatonin 10 mg HS. He will update in 4 weeks. Consider low dose Trazodone if needed.

## 2018-10-20 NOTE — Patient Instructions (Signed)
Stop by the lab prior to leaving today. I will notify you of your results once received.   Start sertraline 50 mg tablets for anxiety. Start by taking 1/2 tablet daily for 6 days, then increase to 1 full tablet thereafter.  Start Melatonin 10 mg tablets for difficulty sleeping. Take 10 mg 2 hours prior to bedtime.  Please update me regarding your anxiety in 4 weeks. Please notify me sooner if you have any problems.  We will see you in one year for your annual exam or sooner if needed.  It was a pleasure to see you today!   Preventive Care 40-64 Years, Male Preventive care refers to lifestyle choices and visits with your health care provider that can promote health and wellness. What does preventive care include?  A yearly physical exam. This is also called an annual well check.  Dental exams once or twice a year.  Routine eye exams. Ask your health care provider how often you should have your eyes checked.  Personal lifestyle choices, including: ? Daily care of your teeth and gums. ? Regular physical activity. ? Eating a healthy diet. ? Avoiding tobacco and drug use. ? Limiting alcohol use. ? Practicing safe sex. ? Taking low-dose aspirin every day starting at age 52. What happens during an annual well check? The services and screenings done by your health care provider during your annual well check will depend on your age, overall health, lifestyle risk factors, and family history of disease. Counseling Your health care provider may ask you questions about your:  Alcohol use.  Tobacco use.  Drug use.  Emotional well-being.  Home and relationship well-being.  Sexual activity.  Eating habits.  Work and work Statistician.  Screening You may have the following tests or measurements:  Height, weight, and BMI.  Blood pressure.  Lipid and cholesterol levels. These may be checked every 5 years, or more frequently if you are over 84 years old.  Skin check.  Lung  cancer screening. You may have this screening every year starting at age 78 if you have a 30-pack-year history of smoking and currently smoke or have quit within the past 15 years.  Fecal occult blood test (FOBT) of the stool. You may have this test every year starting at age 5.  Flexible sigmoidoscopy or colonoscopy. You may have a sigmoidoscopy every 5 years or a colonoscopy every 10 years starting at age 38.  Prostate cancer screening. Recommendations will vary depending on your family history and other risks.  Hepatitis C blood test.  Hepatitis B blood test.  Sexually transmitted disease (STD) testing.  Diabetes screening. This is done by checking your blood sugar (glucose) after you have not eaten for a while (fasting). You may have this done every 1-3 years.  Discuss your test results, treatment options, and if necessary, the need for more tests with your health care provider. Vaccines Your health care provider may recommend certain vaccines, such as:  Influenza vaccine. This is recommended every year.  Tetanus, diphtheria, and acellular pertussis (Tdap, Td) vaccine. You may need a Td booster every 10 years.  Varicella vaccine. You may need this if you have not been vaccinated.  Zoster vaccine. You may need this after age 12.  Measles, mumps, and rubella (MMR) vaccine. You may need at least one dose of MMR if you were born in 1957 or later. You may also need a second dose.  Pneumococcal 13-valent conjugate (PCV13) vaccine. You may need this if you have certain  conditions and have not been vaccinated.  Pneumococcal polysaccharide (PPSV23) vaccine. You may need one or two doses if you smoke cigarettes or if you have certain conditions.  Meningococcal vaccine. You may need this if you have certain conditions.  Hepatitis A vaccine. You may need this if you have certain conditions or if you travel or work in places where you may be exposed to hepatitis A.  Hepatitis B vaccine.  You may need this if you have certain conditions or if you travel or work in places where you may be exposed to hepatitis B.  Haemophilus influenzae type b (Hib) vaccine. You may need this if you have certain risk factors.  Talk to your health care provider about which screenings and vaccines you need and how often you need them. This information is not intended to replace advice given to you by your health care provider. Make sure you discuss any questions you have with your health care provider. Document Released: 12/14/2015 Document Revised: 08/06/2016 Document Reviewed: 09/18/2015 Elsevier Interactive Patient Education  [employer].

## 2018-10-20 NOTE — Assessment & Plan Note (Signed)
Repeat vitamin B 12 pending. Continue with oral B12. Consider IM if needed.

## 2018-10-20 NOTE — Progress Notes (Signed)
Subjective:    Patient ID: Darrell Lawson, male    DOB: 05/30/1971, 47 y.o.   MRN: [id number]  HPI  Darrell Lawson is a 47 year old male who presents today for complete physical.  He'd like to discuss his sleeping pattern. He wakes up at 5 am for work, goes to bed around 9 pm. He will lay in bed, get up and lay in the recliner for 2-3 hours before falling asleep. He does snore some nights, he denies waking gasping for air. He feels tired during the day, doesn't feel like he can fall asleep during the day. He has taken a few doses of his daughter's Melatonin without improvement.   He continues to experience symptoms of difficulty sleeping, feeling anxious, daily worry. He was once taking Lexapro, stopped about one year ago due to side effects. GAD 7 score of 14 today.   BP Readings from Last 3 Encounters:  10/20/18 120/78  09/29/18 138/86  09/16/18 126/80     Immunizations: -Tetanus: Completed in 2018 -Influenza: Completed this season    Diet: He endorses a healthy diet. Breakfast: Toast Lunch: Chips Dinner: Pasta, meat, some vegetables Restaurants Snacks: None  Desserts: 3 days weekly  Beverages: Water, Powerade, vitamin water, sweet tea  Exercise: He is not exercising  Eye exam: Completed in 2019 Dental exam: Completes annually  Colonoscopy: Completed in 2018   Review of Systems  Constitutional: Negative for unexpected weight change.  HENT: Negative for rhinorrhea.   Respiratory: Negative for cough and shortness of breath.   Cardiovascular: Negative for chest pain.  Gastrointestinal: Negative for constipation and diarrhea.  Genitourinary: Negative for difficulty urinating.  Musculoskeletal: Negative for arthralgias and myalgias.  Skin: Negative for rash.  Allergic/Immunologic: Negative for environmental allergies.  Neurological: Negative for dizziness, numbness and headaches.  Psychiatric/Behavioral: The patient is nervous/anxious.        See HPI       Past Medical  History:  Diagnosis Date  . Anxiety   . Diverticulitis large intestine   . Frequent headaches   . Gout      Social History   Socioeconomic History  . Marital status: Married    Spouse name: Not on file  . Number of children: Not on file  . Years of education: Not on file  . Highest education level: Not on file  Occupational History    Employer: [employer]  Social Needs  . Financial resource strain: Not on file  . Food insecurity:    Worry: Not on file    Inability: Not on file  . Transportation needs:    Medical: Not on file    Non-medical: Not on file  Tobacco Use  . Smoking status: Never Smoker  . Smokeless tobacco: Never Used  Substance and Sexual Activity  . Alcohol use: No  . Drug use: No  . Sexual activity: Yes  Lifestyle  . Physical activity:    Days per week: Not on file    Minutes per session: Not on file  . Stress: Not on file  Relationships  . Social connections:    Talks on phone: Not on file    Gets together: Not on file    Attends religious service: Not on file    Active member of club or organization: Not on file    Attends meetings of clubs or organizations: Not on file    Relationship status: Not on file  . Intimate partner violence:    Fear  of current or ex partner: Not on file    Emotionally abused: Not on file    Physically abused: Not on file    Forced sexual activity: Not on file  Other Topics Concern  . Not on file  Social History Narrative  . Not on file    Past Surgical History:  Procedure Laterality Date  . COLONOSCOPY WITH PROPOFOL N/A 11/20/2017   Procedure: COLONOSCOPY WITH PROPOFOL;  Surgeon: Darrell Lawson, Kiran, MD;  [facility];  Service: Gastroenterology;  Laterality: N/A;  . EYE SURGERY Left    age 89    Family History  Problem Relation Age of Onset  . Hypertension Mother   . Hypertension Father   . Heart disease Father   . Diabetes Father   . Cancer Maternal Grandmother        breast     Allergies  Allergen Reactions  . Morphine And Related     Patient believes that possibly in the past morphine made him itchy.    Current Outpatient Medications on File Prior to Visit  Medication Sig Dispense Refill  . amLODipine (NORVASC) 5 MG tablet Take 1 tablet (5 mg total) by mouth daily. 30 tablet 0  . Cyanocobalamin (B-12) 1000 MCG SUBL Place 1 tablet under the tongue daily. 30 each   . Zinc 50 MG CAPS Take 1 capsule by mouth daily.     No current facility-administered medications on file prior to visit.     BP 120/78   Pulse 82   Temp 98 F (36.7 C) (Oral)   Ht 6\' 1"  (1.854 m)   Wt 218 lb (98.9 kg)   SpO2 99%   BMI 28.76 kg/m    Objective:   Physical Exam  Constitutional: He is oriented to person, place, and time. He appears well-nourished.  HENT:  Mouth/Throat: No oropharyngeal exudate.  Eyes: Pupils are equal, round, and reactive to light. EOM are normal.  Neck: Neck supple. No thyromegaly present.  Cardiovascular: Normal rate and regular rhythm.  Respiratory: Effort normal and breath sounds normal.  GI: Soft. Bowel sounds are normal. There is no tenderness.  Musculoskeletal: Normal range of motion.  Neurological: He is alert and oriented to person, place, and time.  Skin: Skin is warm and dry.  Psychiatric: He has a normal mood and affect.           Assessment & Plan:

## 2018-10-20 NOTE — Assessment & Plan Note (Signed)
Immunizations UTD. Colonoscopy UTD. Recommended regular exercise and a healthy diet.  Exam unremarkable. Labs pending. Follow up in 1 year for CPE.

## 2018-10-20 NOTE — Assessment & Plan Note (Signed)
No longer taking Lexapro due to "side effects". GAD 7 score of 14 today. Will initiate low dose Zoloft.  Patient is to take 1/2 tablet daily for 6 days, then advance to 1 full tablet thereafter. We discussed possible side effects of headache, GI upset, drowsiness, and SI/HI. If thoughts of SI/HI develop, we discussed to present to the emergency immediately. Patient verbalized understanding.   He will update in 4 weeks.

## 2018-10-20 NOTE — Assessment & Plan Note (Signed)
No recent bouts of diverticulitis

## 2018-10-20 NOTE — Assessment & Plan Note (Signed)
Continues. Compliant to oral B12. Repeat B12 pending today. Recommended regular exercise. Will also treat anxiety with Zoloft.

## 2018-10-20 NOTE — Assessment & Plan Note (Signed)
Improved on Amlodipine 5 mg, continue same.  

## 2018-10-21 ENCOUNTER — Other Ambulatory Visit: Payer: Self-pay | Admitting: Primary Care

## 2018-10-21 DIAGNOSIS — E538 Deficiency of other specified B group vitamins: Secondary | ICD-10-CM

## 2018-10-26 ENCOUNTER — Telehealth: Payer: Self-pay | Admitting: Primary Care

## 2018-10-26 NOTE — Telephone Encounter (Signed)
Spoken to patient and schedule nurse appt on 10/27/2018

## 2018-10-26 NOTE — Telephone Encounter (Signed)
Pt returned Chan's call about his B12. Please see mychart messages on 10/21/18.

## 2018-10-27 ENCOUNTER — Ambulatory Visit: Payer: 59 | Admitting: Gastroenterology

## 2018-10-27 ENCOUNTER — Ambulatory Visit (INDEPENDENT_AMBULATORY_CARE_PROVIDER_SITE_OTHER): Payer: 59 | Admitting: *Deleted

## 2018-10-27 DIAGNOSIS — E538 Deficiency of other specified B group vitamins: Secondary | ICD-10-CM | POA: Diagnosis not present

## 2018-10-27 MED ORDER — CYANOCOBALAMIN 1000 MCG/ML IJ SOLN
1000.0000 ug | Freq: Once | INTRAMUSCULAR | Status: AC
  Administered 2018-10-27: 1000 ug via INTRAMUSCULAR

## 2018-10-27 NOTE — Progress Notes (Signed)
Per orders of Kate Clark, AGNP-C, injection of B12 1000 mcg/mL given by Allye Hoyos. Patient tolerated injection well.   

## 2018-11-17 ENCOUNTER — Encounter: Payer: 59 | Admitting: Primary Care

## 2018-11-18 DIAGNOSIS — F5101 Primary insomnia: Secondary | ICD-10-CM

## 2018-11-19 MED ORDER — TRAZODONE HCL 50 MG PO TABS: 25.0000 mg | ORAL_TABLET | Freq: Every evening | ORAL | 0 refills | Status: DC | PRN

## 2018-12-06 ENCOUNTER — Ambulatory Visit (INDEPENDENT_AMBULATORY_CARE_PROVIDER_SITE_OTHER): Payer: 59 | Admitting: *Deleted

## 2018-12-06 DIAGNOSIS — E538 Deficiency of other specified B group vitamins: Secondary | ICD-10-CM

## 2018-12-06 MED ORDER — CYANOCOBALAMIN 1000 MCG/ML IJ SOLN
1000.0000 ug | Freq: Once | INTRAMUSCULAR | Status: AC
  Administered 2018-12-06: 1000 ug via INTRAMUSCULAR

## 2018-12-06 NOTE — Progress Notes (Signed)
Per orders of Kate Clark, AGNP-C, injection of B12 1000 mcg/mL given by Anthon Harpole. Patient tolerated injection well.   

## 2019-01-10 ENCOUNTER — Telehealth: Payer: Self-pay | Admitting: Primary Care

## 2019-01-10 NOTE — Telephone Encounter (Signed)
Pt want to get his B-12 shot on Wednesday but there wasn't any open slots. Pt wanted me to send you a message. Please call pt.

## 2019-01-10 NOTE — Telephone Encounter (Signed)
Pt's wife called to get him scheduled for b12 inj. He is overdue. Please advise if we can get the pt in on Wednesday 2/12

## 2019-01-11 ENCOUNTER — Other Ambulatory Visit: Payer: Self-pay | Admitting: Primary Care

## 2019-01-11 DIAGNOSIS — F411 Generalized anxiety disorder: Secondary | ICD-10-CM

## 2019-01-11 NOTE — Telephone Encounter (Signed)
Patient is on the schedule for 845 tomorrow am for his B12.   Patient confirmed appointment.

## 2019-01-11 NOTE — Telephone Encounter (Signed)
I have spoken to patient this morning and had this schedule.

## 2019-01-12 ENCOUNTER — Ambulatory Visit (INDEPENDENT_AMBULATORY_CARE_PROVIDER_SITE_OTHER): Payer: 59 | Admitting: *Deleted

## 2019-01-12 DIAGNOSIS — E538 Deficiency of other specified B group vitamins: Secondary | ICD-10-CM | POA: Diagnosis not present

## 2019-01-12 MED ORDER — CYANOCOBALAMIN 1000 MCG/ML IJ SOLN
1000.0000 ug | Freq: Once | INTRAMUSCULAR | Status: AC
  Administered 2019-01-12: 1000 ug via INTRAMUSCULAR

## 2019-01-12 NOTE — Progress Notes (Signed)
Per orders of Mayra Reel, NP injection of B12 1067mcg/mL  given by Delshawn Stech. Patient tolerated injection well.

## 2019-01-18 ENCOUNTER — Other Ambulatory Visit: Payer: Self-pay | Admitting: Primary Care

## 2019-01-18 DIAGNOSIS — F5101 Primary insomnia: Secondary | ICD-10-CM

## 2019-01-18 NOTE — Telephone Encounter (Signed)
See my chart message from patient.  Patient never took trazodone and would not like to start.

## 2019-01-18 NOTE — Telephone Encounter (Signed)
See my chart message. Awaiting response. 

## 2019-01-18 NOTE — Telephone Encounter (Signed)
Last prescribed on 11/19/2018. Last office visit on 10/20/2018 (CPE). No future appointment

## 2019-04-01 ENCOUNTER — Telehealth: Payer: Self-pay | Admitting: Primary Care

## 2019-04-01 NOTE — Telephone Encounter (Signed)
Please notify patient that I would like to follow up with him regarding his insomnia and anxiety. Schedule a virtual visit at his convenience. Thanks!

## 2019-04-10 ENCOUNTER — Other Ambulatory Visit: Payer: Self-pay | Admitting: Primary Care

## 2019-04-10 DIAGNOSIS — F411 Generalized anxiety disorder: Secondary | ICD-10-CM

## 2019-04-20 ENCOUNTER — Encounter: Payer: Self-pay | Admitting: Primary Care

## 2019-04-20 ENCOUNTER — Ambulatory Visit (INDEPENDENT_AMBULATORY_CARE_PROVIDER_SITE_OTHER): Payer: 59 | Admitting: Primary Care

## 2019-04-20 DIAGNOSIS — J320 Chronic maxillary sinusitis: Secondary | ICD-10-CM

## 2019-04-20 DIAGNOSIS — F411 Generalized anxiety disorder: Secondary | ICD-10-CM | POA: Diagnosis not present

## 2019-04-20 DIAGNOSIS — E538 Deficiency of other specified B group vitamins: Secondary | ICD-10-CM | POA: Diagnosis not present

## 2019-04-20 DIAGNOSIS — F5101 Primary insomnia: Secondary | ICD-10-CM

## 2019-04-20 DIAGNOSIS — R5383 Other fatigue: Secondary | ICD-10-CM | POA: Diagnosis not present

## 2019-04-20 MED ORDER — SERTRALINE HCL 50 MG PO TABS: 50.0000 mg | ORAL_TABLET | Freq: Every day | ORAL | 2 refills | Status: DC

## 2019-04-20 MED ORDER — CYANOCOBALAMIN 1000 MCG/ML IJ SOLN
1000.0000 ug | Freq: Once | INTRAMUSCULAR | Status: AC
  Administered 2019-04-20: 1000 ug via INTRAMUSCULAR

## 2019-04-20 NOTE — Assessment & Plan Note (Signed)
Never took Trazodone, doing better on Zoloft. Using Unisom infrequently PRN. Continue to monitor.

## 2019-04-20 NOTE — Assessment & Plan Note (Signed)
Symptoms for the last 6 days, overall appears well. Discussed to switch to Xyzal, continue Flonase. He will update if symptoms persist.

## 2019-04-20 NOTE — Assessment & Plan Note (Signed)
Improved on Zoloft 50 mg tablets, denies SI/HI. Continue same.

## 2019-04-20 NOTE — Patient Instructions (Signed)
Please message or call me early next week in regards to your sinus pressure.  Continue Zoloft 50 mg daily.  Schedule monthly B12 visits for the next three months.  Schedule a lab appointment for four months.  It was a pleasure to see you today!

## 2019-04-20 NOTE — Assessment & Plan Note (Signed)
Repeat B12 IM provided today. Continue IM injections for another 3 months, then repeat B12 lab.

## 2019-04-20 NOTE — Addendum Note (Signed)
Addended by: Tawnya Crook on: 04/20/2019 09:39 AM   Modules accepted: Orders

## 2019-04-20 NOTE — Assessment & Plan Note (Signed)
Labs from November 2019 with low vitamin B12, otherwise unremarkable.  Did receive IM B12 in November 2019, January 2020, February 2020, and today.  Continue IM injections for another 3 months, then recheck vitamin B12. Anxiety has improved.

## 2019-04-20 NOTE — Progress Notes (Signed)
Subjective:    Patient ID: Darrell Lawson, male    DOB: 12/03/1970, 48 y.o.   MRN: [id number]  HPI  [name] is a 48 year old male who presents today for follow up and a chief complaint of sinus pressure.  1) Vitamin B 12 Deficiency: Currently prescribed IM B12 1000 mcg injections. His last injection was in February 2020 as he is inconsistent. His last B12 lab was 187 in November 2019.  He continues to experience fatigue.   2) GAD/Insomnia: Currently managed on Zoloft 50 mg and Trazodone 50 mg HS for which was initiated in November 2019 for symptoms of difficulty sleeping, feeling anxious, daily worry. He was previously managed on Lexapro for which he stopped one year ago due to side effects. GAD 7 score of 14 that day.  Since his last visit he's doing better. Positive effects of Zoloft include less anxiety, less worry, and is sleeping better. He never filled the Trazodone.   3) Sinus Pressure: Located to the left maxillary sinuses and left ear x 6 days. Also with nasal congestion, post nasal drip. He's tried Flonase and Claritin with temporary improvement. He denies fevers. He is not blowing mucous from his nasal cavity.  Review of Systems  Constitutional: Positive for fatigue. Negative for fever.  HENT: Positive for congestion, postnasal drip and sinus pressure.   Respiratory: Negative for shortness of breath.   Cardiovascular: Negative for chest pain.  Psychiatric/Behavioral: Negative for sleep disturbance. The patient is not nervous/anxious.        Past Medical History:  Diagnosis Date  . Anxiety   . Diverticulitis large intestine   . Frequent headaches   . Gout      Social History   Socioeconomic History  . Marital status: Married    Spouse name: Not on file  . Number of children: Not on file  . Years of education: Not on file  . Highest education level: Not on file  Occupational History    Employer: [employer]  Social Needs  . Financial resource  strain: Not on file  . Food insecurity:    Worry: Not on file    Inability: Not on file  . Transportation needs:    Medical: Not on file    Non-medical: Not on file  Tobacco Use  . Smoking status: Never Smoker  . Smokeless tobacco: Never Used  Substance and Sexual Activity  . Alcohol use: No  . Drug use: No  . Sexual activity: Yes  Lifestyle  . Physical activity:    Days per week: Not on file    Minutes per session: Not on file  . Stress: Not on file  Relationships  . Social connections:    Talks on phone: Not on file    Gets together: Not on file    Attends religious service: Not on file    Active member of club or organization: Not on file    Attends meetings of clubs or organizations: Not on file    Relationship status: Not on file  . Intimate partner violence:    Fear of current or ex partner: Not on file    Emotionally abused: Not on file    Physically abused: Not on file    Forced sexual activity: Not on file  Other Topics Concern  . Not on file  Social History Narrative  . Not on file    Past Surgical History:  Procedure Laterality Date  . COLONOSCOPY WITH PROPOFOL  N/A 11/20/2017   Procedure: COLONOSCOPY WITH PROPOFOL;  Surgeon: Wyline Mood, MD;  Location: [facility] ENDOSCOPY;  Service: Gastroenterology;  Laterality: N/A;  . EYE SURGERY Left    age 28    Family History  Problem Relation Age of Onset  . Hypertension Mother   . Hypertension Father   . Heart disease Father   . Diabetes Father   . Cancer Maternal Grandmother        breast    Allergies  Allergen Reactions  . Morphine And Related     Patient believes that possibly in the past morphine made him itchy.    Current Outpatient Medications on File Prior to Visit  Medication Sig Dispense Refill  . amLODipine (NORVASC) 5 MG tablet Take 1 tablet (5 mg total) by mouth daily. 90 tablet 3  . cyanocobalamin 1000 MCG tablet Take 1,000 mcg by mouth every 30 (thirty) days.    Marland Kitchen sertraline (ZOLOFT) 50 MG  tablet TAKE 1 TABLET (50 MG TOTAL) BY MOUTH DAILY. FOR ANXIETY. 90 tablet 0  . Zinc 50 MG CAPS Take 1 capsule by mouth daily.     No current facility-administered medications on file prior to visit.     BP 126/80   Pulse 76   Temp 98 F (36.7 C) (Oral)   Ht 6\' 1"  (1.854 m)   Wt 226 lb 12 oz (102.9 kg)   SpO2 99%   BMI 29.92 kg/m    Objective:   Physical Exam  Constitutional: He appears well-nourished. He does not appear ill.  HENT:  Right Ear: Tympanic membrane and ear canal normal.  Left Ear: Tympanic membrane and ear canal normal.  Nose: No mucosal edema. Right sinus exhibits no maxillary sinus tenderness and no frontal sinus tenderness. Left sinus exhibits maxillary sinus tenderness and frontal sinus tenderness.  Mouth/Throat: Oropharynx is clear and moist.  Neck: Neck supple.  Cardiovascular: Normal rate and regular rhythm.  Respiratory: Effort normal and breath sounds normal. He has no wheezes.  Skin: Skin is warm and dry.  Psychiatric: He has a normal mood and affect.           Assessment & Plan:

## 2019-04-25 DIAGNOSIS — J320 Chronic maxillary sinusitis: Secondary | ICD-10-CM

## 2019-04-26 MED ORDER — AMOXICILLIN-POT CLAVULANATE 875-125 MG PO TABS: 1.0000 | ORAL_TABLET | Freq: Two times a day (BID) | ORAL | 0 refills | Status: DC

## 2019-05-24 ENCOUNTER — Ambulatory Visit: Payer: 59

## 2019-05-25 ENCOUNTER — Ambulatory Visit (INDEPENDENT_AMBULATORY_CARE_PROVIDER_SITE_OTHER): Payer: 59

## 2019-05-25 DIAGNOSIS — E538 Deficiency of other specified B group vitamins: Secondary | ICD-10-CM

## 2019-05-25 MED ORDER — CYANOCOBALAMIN 1000 MCG/ML IJ SOLN
1000.0000 ug | Freq: Once | INTRAMUSCULAR | Status: AC
  Administered 2019-05-25: 1000 ug via INTRAMUSCULAR

## 2019-05-25 NOTE — Progress Notes (Signed)
Per orders of NP Katherine Clark, injection of vit B12 given by Jibri Schriefer. Patient tolerated injection well.  

## 2019-06-28 ENCOUNTER — Ambulatory Visit: Payer: 59

## 2019-06-29 ENCOUNTER — Other Ambulatory Visit: Payer: Self-pay

## 2019-06-29 ENCOUNTER — Other Ambulatory Visit: Payer: 59

## 2019-06-29 ENCOUNTER — Telehealth: Payer: Self-pay

## 2019-06-29 ENCOUNTER — Ambulatory Visit: Payer: 59

## 2019-06-29 NOTE — Telephone Encounter (Signed)
Noted. Yes, would like to see B12 lab completed at minimum. Thank you for the follow up on this.

## 2019-06-29 NOTE — Telephone Encounter (Signed)
Left message for patient to call back. Did not show up for b12 nurse visit and he also is overdue for recheck of B12 lab. Left messaging advising patient we could still see him today for this but I need to know when he can come and make sure I am available. FYI to PCP also

## 2019-07-03 ENCOUNTER — Other Ambulatory Visit: Payer: Self-pay | Admitting: Primary Care

## 2019-07-06 ENCOUNTER — Ambulatory Visit (INDEPENDENT_AMBULATORY_CARE_PROVIDER_SITE_OTHER): Payer: 59

## 2019-07-06 DIAGNOSIS — E538 Deficiency of other specified B group vitamins: Secondary | ICD-10-CM | POA: Diagnosis not present

## 2019-07-06 MED ORDER — CYANOCOBALAMIN 1000 MCG/ML IJ SOLN
1000.0000 ug | Freq: Once | INTRAMUSCULAR | Status: AC
  Administered 2019-07-06: 1000 ug via INTRAMUSCULAR

## 2019-07-06 NOTE — Progress Notes (Signed)
Per orders of Katherine Clark, NP, injection of B12 given by Devontenno, Melanie Y.  Patient tolerated injection well.  

## 2019-07-19 ENCOUNTER — Telehealth: Payer: Self-pay

## 2019-07-19 NOTE — Telephone Encounter (Signed)
Left detailed VM w COVID screen and front door info due to construction

## 2019-07-21 ENCOUNTER — Other Ambulatory Visit: Payer: 59

## 2019-09-14 ENCOUNTER — Ambulatory Visit: Payer: 59

## 2019-10-11 ENCOUNTER — Other Ambulatory Visit: Payer: Self-pay | Admitting: Primary Care

## 2019-10-11 DIAGNOSIS — I10 Essential (primary) hypertension: Secondary | ICD-10-CM

## 2019-11-07 ENCOUNTER — Other Ambulatory Visit: Payer: Self-pay | Admitting: Primary Care

## 2019-11-07 DIAGNOSIS — Z0000 Encounter for general adult medical examination without abnormal findings: Secondary | ICD-10-CM

## 2019-11-07 DIAGNOSIS — I10 Essential (primary) hypertension: Secondary | ICD-10-CM

## 2019-11-07 DIAGNOSIS — E538 Deficiency of other specified B group vitamins: Secondary | ICD-10-CM

## 2019-11-11 ENCOUNTER — Other Ambulatory Visit (INDEPENDENT_AMBULATORY_CARE_PROVIDER_SITE_OTHER): Payer: 59

## 2019-11-11 ENCOUNTER — Other Ambulatory Visit: Payer: Self-pay

## 2019-11-11 DIAGNOSIS — E538 Deficiency of other specified B group vitamins: Secondary | ICD-10-CM

## 2019-11-11 DIAGNOSIS — I10 Essential (primary) hypertension: Secondary | ICD-10-CM | POA: Diagnosis not present

## 2019-11-11 LAB — COMPREHENSIVE METABOLIC PANEL
ALT: 18 U/L (ref 0–53)
AST: 15 U/L (ref 0–37)
Albumin: 4.3 g/dL (ref 3.5–5.2)
Alkaline Phosphatase: 61 U/L (ref 39–117)
BUN: 12 mg/dL (ref 6–23)
CO2: 28 mEq/L (ref 19–32)
Calcium: 9.2 mg/dL (ref 8.4–10.5)
Chloride: 102 mEq/L (ref 96–112)
Creatinine, Ser: 1.02 mg/dL (ref 0.40–1.50)
GFR: 77.73 mL/min (ref >60.00)
Glucose, Bld: 106 mg/dL — ABNORMAL HIGH (ref 70–99)
Potassium: 4.8 mEq/L (ref 3.5–5.1)
Sodium: 137 mEq/L (ref 135–145)
Total Bilirubin: 0.5 mg/dL (ref 0.2–1.2)
Total Protein: 6.6 g/dL (ref 6.0–8.3)

## 2019-11-11 LAB — LIPID PANEL
Cholesterol: 189 mg/dL (ref 0–200)
HDL: 46.1 mg/dL (ref >39.00)
LDL Cholesterol: 128 mg/dL — ABNORMAL HIGH (ref 0–99)
NonHDL: 142.77
Total CHOL/HDL Ratio: 4
Triglycerides: 76 mg/dL (ref 0.0–149.0)
VLDL: 15.2 mg/dL (ref 0.0–40.0)

## 2019-11-11 LAB — CBC
HCT: 41.3 % (ref 39.0–52.0)
Hemoglobin: 14.1 g/dL (ref 13.0–17.0)
MCHC: 34.1 g/dL (ref 30.0–36.0)
MCV: 93.9 fl (ref 78.0–100.0)
Platelets: 332 10*3/uL (ref 150.0–400.0)
RBC: 4.4 Mil/uL (ref 4.22–5.81)
RDW: 13.2 % (ref 11.5–15.5)
WBC: 4.4 10*3/uL (ref 4.0–10.5)

## 2019-11-11 LAB — VITAMIN B12: Vitamin B-12: 200 pg/mL — ABNORMAL LOW (ref 211–911)

## 2019-11-18 ENCOUNTER — Other Ambulatory Visit: Payer: Self-pay

## 2019-11-18 ENCOUNTER — Encounter: Payer: Self-pay | Admitting: Primary Care

## 2019-11-18 ENCOUNTER — Ambulatory Visit (INDEPENDENT_AMBULATORY_CARE_PROVIDER_SITE_OTHER): Payer: 59 | Admitting: Primary Care

## 2019-11-18 VITALS — BP 130/80 | HR 88 | Temp 96.9°F | Ht 73.0 in | Wt 226.2 lb

## 2019-11-18 DIAGNOSIS — R739 Hyperglycemia, unspecified: Secondary | ICD-10-CM | POA: Diagnosis not present

## 2019-11-18 DIAGNOSIS — F411 Generalized anxiety disorder: Secondary | ICD-10-CM

## 2019-11-18 DIAGNOSIS — I10 Essential (primary) hypertension: Secondary | ICD-10-CM | POA: Diagnosis not present

## 2019-11-18 DIAGNOSIS — R351 Nocturia: Secondary | ICD-10-CM | POA: Diagnosis not present

## 2019-11-18 DIAGNOSIS — E538 Deficiency of other specified B group vitamins: Secondary | ICD-10-CM

## 2019-11-18 DIAGNOSIS — Z0000 Encounter for general adult medical examination without abnormal findings: Secondary | ICD-10-CM | POA: Diagnosis not present

## 2019-11-18 DIAGNOSIS — K219 Gastro-esophageal reflux disease without esophagitis: Secondary | ICD-10-CM

## 2019-11-18 DIAGNOSIS — Z23 Encounter for immunization: Secondary | ICD-10-CM | POA: Diagnosis not present

## 2019-11-18 DIAGNOSIS — F5101 Primary insomnia: Secondary | ICD-10-CM

## 2019-11-18 LAB — PSA: PSA: 0.78 ng/mL (ref 0.10–4.00)

## 2019-11-18 LAB — HEMOGLOBIN A1C: Hgb A1c MFr Bld: 5.6 % (ref 4.6–6.5)

## 2019-11-18 MED ORDER — SERTRALINE HCL 50 MG PO TABS: 50.0000 mg | ORAL_TABLET | Freq: Every day | ORAL | 3 refills | Status: DC

## 2019-11-18 MED ORDER — AMLODIPINE BESYLATE 5 MG PO TABS: 5.0000 mg | ORAL_TABLET | Freq: Every day | ORAL | 3 refills | Status: DC

## 2019-11-18 NOTE — Assessment & Plan Note (Signed)
Tetanus UTD, influenza vaccine provided today. PSA pending given symptoms. Colonoscopy UTD, due in 2028. Encouraged a healthy diet and regular exercise.  Exam today benign. Labs reviewed and also pending.

## 2019-11-18 NOTE — Progress Notes (Signed)
Subjective:    Patient ID: Darrell Lawson, male    DOB: Jul 22, 1971, 48 y.o.   MRN: [id number]  HPI  Mr. Vizzini is a 48 year old male who presents today for complete physical.  Immunizations: -Tetanus: Completed in 2018 -Influenza: Due today  Diet: He endorses a fair diet. Combination of fast food and home cooked meals. He drinks water, sweet tea, soda.  Exercise: He is active at work, no regular exercise  Eye exam: No recent exam, he will schedule  Dental exam: No recent exam  Colonoscopy: Completed in 2018, due in 2028  BP Readings from Last 3 Encounters:  11/18/19 130/80  04/20/19 126/80  10/20/18 120/78   The 10-year ASCVD risk score Mikey Bussing DC Jr., et al., 2013) is: 3.4%   Values used to calculate the score:     Age: 17 years     Sex: Male     Is Non-Hispanic African American: No     Diabetic: No     Tobacco smoker: No     Systolic Blood Pressure: 902 mmHg     Is BP treated: Yes     HDL Cholesterol: 46.1 mg/dL     Total Cholesterol: 189 mg/dL    Review of Systems  Constitutional: Negative for unexpected weight change.  HENT: Negative for rhinorrhea.   Respiratory: Negative for cough and shortness of breath.   Cardiovascular: Negative for chest pain.  Gastrointestinal: Negative for constipation and diarrhea.  Genitourinary: Negative for difficulty urinating.       Nocturia 4-7 times nightly.  Drinks sweet tea with dinner, water before bed. Recently stopped.   Musculoskeletal: Negative for arthralgias and myalgias.  Skin: Negative for rash.  Allergic/Immunologic: Positive for environmental allergies.  Neurological: Negative for dizziness, numbness and headaches.  Psychiatric/Behavioral: The patient is not nervous/anxious.        Past Medical History:  Diagnosis Date  . Anxiety   . Diverticulitis large intestine   . Frequent headaches   . Gout      Social History   Socioeconomic History  . Marital status: Married    Spouse name: Not on file  .  Number of children: Not on file  . Years of education: Not on file  . Highest education level: Not on file  Occupational History    Employer: [employer]  Tobacco Use  . Smoking status: Never Smoker  . Smokeless tobacco: Never Used  Substance and Sexual Activity  . Alcohol use: No  . Drug use: No  . Sexual activity: Yes  Other Topics Concern  . Not on file  Social History Narrative  . Not on file   Social Determinants of Health   Financial Resource Strain:   . Difficulty of Paying Living Expenses: Not on file  Food Insecurity:   . Worried About Charity fundraiser in the Last Year: Not on file  . Ran Out of Food in the Last Year: Not on file  Transportation Needs:   . Lack of Transportation (Medical): Not on file  . Lack of Transportation (Non-Medical): Not on file  Physical Activity:   . Days of Exercise per Week: Not on file  . Minutes of Exercise per Session: Not on file  Stress:   . Feeling of Stress : Not on file  Social Connections:   . Frequency of Communication with Friends and Family: Not on file  . Frequency of Social Gatherings with Friends and Family: Not on file  . Attends  Religious Services: Not on file  . Active Member of Clubs or Organizations: Not on file  . Attends Banker Meetings: Not on file  . Marital Status: Not on file  Intimate Partner Violence:   . Fear of Current or Ex-Partner: Not on file  . Emotionally Abused: Not on file  . Physically Abused: Not on file  . Sexually Abused: Not on file    Past Surgical History:  Procedure Laterality Date  . COLONOSCOPY WITH PROPOFOL N/A 11/20/2017   Procedure: COLONOSCOPY WITH PROPOFOL;  Surgeon: Wyline Mood, MD;  Location: [facility] ENDOSCOPY;  Service: Gastroenterology;  Laterality: N/A;  . EYE SURGERY Left    age 32    Family History  Problem Relation Age of Onset  . Hypertension Mother   . Hypertension Father   . Heart disease Father   . Diabetes Father   . Cancer  Maternal Grandmother        breast    Allergies  Allergen Reactions  . Morphine And Related     Patient believes that possibly in the past morphine made him itchy.    Current Outpatient Medications on File Prior to Visit  Medication Sig Dispense Refill  . cyanocobalamin 1000 MCG tablet Take 1,000 mcg by mouth every 30 (thirty) days.    . Zinc 50 MG CAPS Take 1 capsule by mouth daily.     No current facility-administered medications on file prior to visit.    BP 130/80   Pulse 88   Temp (!) 96.9 F (36.1 C) (Temporal)   Ht 6\' 1"  (1.854 m)   Wt 226 lb 4 oz (102.6 kg)   SpO2 98%   BMI 29.85 kg/m    Objective:   Physical Exam  Constitutional: He is oriented to person, place, and time. He appears well-nourished.  HENT:  Right Ear: Tympanic membrane and ear canal normal.  Left Ear: Tympanic membrane and ear canal normal.  Mouth/Throat: Oropharynx is clear and moist.  Eyes: Pupils are equal, round, and reactive to light. EOM are normal.  Cardiovascular: Normal rate and regular rhythm.  Respiratory: Effort normal and breath sounds normal.  GI: Soft. Bowel sounds are normal. There is no abdominal tenderness.  Musculoskeletal:        General: Normal range of motion.     Cervical back: Neck supple.  Neurological: He is alert and oriented to person, place, and time. No cranial nerve deficit.  Reflex Scores:      Patellar reflexes are 2+ on the right side and 2+ on the left side. Skin: Skin is warm and dry.  Psychiatric: He has a normal mood and affect.           Assessment & Plan:

## 2019-11-18 NOTE — Assessment & Plan Note (Signed)
Doing well with sertraline, continue same.

## 2019-11-18 NOTE — Assessment & Plan Note (Signed)
Stable in the office today, continue amlodipine.  

## 2019-11-18 NOTE — Assessment & Plan Note (Signed)
Continued symptoms but he did just recently cut out sweet tea with dinner and limit his liquids at bedtime.  Offered rectal exam for prostate evaluation, he declines. PSA pending.  He will continue to limit caffeine after 12 pm and limit liquids before bed. Offered Urology evaluation vs tamsulosin treatment, he will think about and update if symptoms persist.

## 2019-11-18 NOTE — Patient Instructions (Signed)
Stop by the lab prior to leaving today. I will notify you of your results once received.   Start exercising. You should be getting 150 minutes of moderate intensity exercise weekly.  It's important to improve your diet by reducing consumption of fast food, fried food, processed snack foods, sugary drinks. Increase consumption of fresh vegetables and fruits, whole grains, water.  Ensure you are drinking 64 ounces of water daily.  Limit caffeine after 12 pm.  Limit liquids within one hour of bed. Please update me if your symptoms persist.  It was a pleasure to see you today!   Preventive Care 78-34 Years Old, Male Preventive care refers to lifestyle choices and visits with your health care provider that can promote health and wellness. This includes:  A yearly physical exam. This is also called an annual well check.  Regular dental and eye exams.  Immunizations.  Screening for certain conditions.  Healthy lifestyle choices, such as eating a healthy diet, getting regular exercise, not using drugs or products that contain nicotine and tobacco, and limiting alcohol use. What can I expect for my preventive care visit? Physical exam Your health care provider will check:  Height and weight. These may be used to calculate body mass index (BMI), which is a measurement that tells if you are at a healthy weight.  Heart rate and blood pressure.  Your skin for abnormal spots. Counseling Your health care provider may ask you questions about:  Alcohol, tobacco, and drug use.  Emotional well-being.  Home and relationship well-being.  Sexual activity.  Eating habits.  Work and work Statistician. What immunizations do I need?  Influenza (flu) vaccine  This is recommended every year. Tetanus, diphtheria, and pertussis (Tdap) vaccine  You may need a Td booster every 10 years. Varicella (chickenpox) vaccine  You may need this vaccine if you have not already been  vaccinated. Zoster (shingles) vaccine  You may need this after age 45. Measles, mumps, and rubella (MMR) vaccine  You may need at least one dose of MMR if you were born in 1957 or later. You may also need a second dose. Pneumococcal conjugate (PCV13) vaccine  You may need this if you have certain conditions and were not previously vaccinated. Pneumococcal polysaccharide (PPSV23) vaccine  You may need one or two doses if you smoke cigarettes or if you have certain conditions. Meningococcal conjugate (MenACWY) vaccine  You may need this if you have certain conditions. Hepatitis A vaccine  You may need this if you have certain conditions or if you travel or work in places where you may be exposed to hepatitis A. Hepatitis B vaccine  You may need this if you have certain conditions or if you travel or work in places where you may be exposed to hepatitis B. Haemophilus influenzae type b (Hib) vaccine  You may need this if you have certain risk factors. Human papillomavirus (HPV) vaccine  If recommended by your health care provider, you may need three doses over 6 months. You may receive vaccines as individual doses or as more than one vaccine together in one shot (combination vaccines). Talk with your health care provider about the risks and benefits of combination vaccines. What tests do I need? Blood tests  Lipid and cholesterol levels. These may be checked every 5 years, or more frequently if you are over 25 years old.  Hepatitis C test.  Hepatitis B test. Screening  Lung cancer screening. You may have this screening every year starting at age  55 if you have a 30-pack-year history of smoking and currently smoke or have quit within the past 15 years.  Prostate cancer screening. Recommendations will vary depending on your family history and other risks.  Colorectal cancer screening. All adults should have this screening starting at age 25 and continuing until age 19. Your  health care provider may recommend screening at age 71 if you are at increased risk. You will have tests every 1-10 years, depending on your results and the type of screening test.  Diabetes screening. This is done by checking your blood sugar (glucose) after you have not eaten for a while (fasting). You may have this done every 1-3 years.  Sexually transmitted disease (STD) testing. Follow these instructions at home: Eating and drinking  Eat a diet that includes fresh fruits and vegetables, whole grains, lean protein, and low-fat dairy products.  Take vitamin and mineral supplements as recommended by your health care provider.  Do not drink alcohol if your health care provider tells you not to drink.  If you drink alcohol: ? Limit how much you have to 0-2 drinks a day. ? Be aware of how much alcohol is in your drink. In the [place] one drink equals one 12 oz bottle of beer (355 mL), one 5 oz glass of wine (148 mL), or one 1 oz glass of hard liquor (44 mL). Lifestyle  Take daily care of your teeth and gums.  Stay active. Exercise for at least 30 minutes on 5 or more days each week.  Do not use any products that contain nicotine or tobacco, such as cigarettes, e-cigarettes, and chewing tobacco. If you need help quitting, ask your health care provider.  If you are sexually active, practice safe sex. Use a condom or other form of protection to prevent STIs (sexually transmitted infections).  Talk with your health care provider about taking a low-dose aspirin every day starting at age 33. What's next?  Go to your health care provider once a year for a well check visit.  Ask your health care provider how often you should have your eyes and teeth checked.  Stay up to date on all vaccines. This information is not intended to replace advice given to you by your health care provider. Make sure you discuss any questions you have with your health care provider. Document Released: 12/14/2015  Document Revised: 11/11/2018 Document Reviewed: 11/11/2018 Elsevier Patient Education  2020 [employer].

## 2019-11-18 NOTE — Assessment & Plan Note (Signed)
Denies concerns for reflux.

## 2019-11-18 NOTE — Addendum Note (Signed)
Addended by: Jacqualin Combes on: 11/18/2019 12:34 PM   Modules accepted: Orders

## 2019-11-18 NOTE — Assessment & Plan Note (Signed)
Not taking oral supplementation, recent level of 200. Discussed to start 1000 mcg once daily.  Repeat levels in 6 months.

## 2019-11-18 NOTE — Assessment & Plan Note (Signed)
Doing well with sertraline, continue same. Refills sent to pharmacy.

## 2019-12-09 ENCOUNTER — Other Ambulatory Visit: Payer: 59

## 2019-12-16 ENCOUNTER — Encounter: Payer: 59 | Admitting: Primary Care

## 2020-10-17 ENCOUNTER — Telehealth: Payer: Self-pay | Admitting: Primary Care

## 2020-10-17 NOTE — Telephone Encounter (Signed)
Can you call and see if you can move up appointment

## 2020-10-17 NOTE — Telephone Encounter (Signed)
Pt wife called in needed to get an earlier appointment than 12/07/20 due to insurance.   Please advise.

## 2020-10-18 NOTE — Telephone Encounter (Signed)
Called patient to move up physical. LVM to call back

## 2020-10-21 ENCOUNTER — Other Ambulatory Visit: Payer: Self-pay | Admitting: Primary Care

## 2020-10-21 DIAGNOSIS — E538 Deficiency of other specified B group vitamins: Secondary | ICD-10-CM

## 2020-10-21 DIAGNOSIS — I10 Essential (primary) hypertension: Secondary | ICD-10-CM

## 2020-10-23 NOTE — Telephone Encounter (Signed)
Viewed schedule and pt scheduled sooner.

## 2020-10-30 ENCOUNTER — Other Ambulatory Visit (INDEPENDENT_AMBULATORY_CARE_PROVIDER_SITE_OTHER): Payer: 59

## 2020-10-30 ENCOUNTER — Other Ambulatory Visit: Payer: Self-pay

## 2020-10-30 DIAGNOSIS — E538 Deficiency of other specified B group vitamins: Secondary | ICD-10-CM

## 2020-10-30 DIAGNOSIS — I10 Essential (primary) hypertension: Secondary | ICD-10-CM | POA: Diagnosis not present

## 2020-10-30 LAB — VITAMIN B12: Vitamin B-12: 152 pg/mL — ABNORMAL LOW (ref 211–911)

## 2020-10-30 LAB — CBC
HCT: 43 % (ref 39.0–52.0)
Hemoglobin: 14.4 g/dL (ref 13.0–17.0)
MCHC: 33.6 g/dL (ref 30.0–36.0)
MCV: 93.3 fl (ref 78.0–100.0)
Platelets: 349 10*3/uL (ref 150.0–400.0)
RBC: 4.61 Mil/uL (ref 4.22–5.81)
RDW: 13 % (ref 11.5–15.5)
WBC: 4.4 10*3/uL (ref 4.0–10.5)

## 2020-10-30 LAB — COMPREHENSIVE METABOLIC PANEL
ALT: 17 U/L (ref 0–53)
AST: 17 U/L (ref 0–37)
Albumin: 4.5 g/dL (ref 3.5–5.2)
Alkaline Phosphatase: 53 U/L (ref 39–117)
BUN: 11 mg/dL (ref 6–23)
CO2: 30 mEq/L (ref 19–32)
Calcium: 9.3 mg/dL (ref 8.4–10.5)
Chloride: 103 mEq/L (ref 96–112)
Creatinine, Ser: 0.98 mg/dL (ref 0.40–1.50)
GFR: 90.46 mL/min (ref >60.00)
Glucose, Bld: 103 mg/dL — ABNORMAL HIGH (ref 70–99)
Potassium: 4.3 mEq/L (ref 3.5–5.1)
Sodium: 139 mEq/L (ref 135–145)
Total Bilirubin: 0.4 mg/dL (ref 0.2–1.2)
Total Protein: 7.1 g/dL (ref 6.0–8.3)

## 2020-10-30 LAB — LIPID PANEL
Cholesterol: 189 mg/dL (ref 0–200)
HDL: 55.4 mg/dL (ref >39.00)
LDL Cholesterol: 121 mg/dL — ABNORMAL HIGH (ref 0–99)
NonHDL: 133.97
Total CHOL/HDL Ratio: 3
Triglycerides: 63 mg/dL (ref 0.0–149.0)
VLDL: 12.6 mg/dL (ref 0.0–40.0)

## 2020-11-06 ENCOUNTER — Ambulatory Visit: Payer: 59 | Admitting: Primary Care

## 2020-11-09 ENCOUNTER — Other Ambulatory Visit: Payer: Self-pay

## 2020-11-09 ENCOUNTER — Telehealth: Payer: Self-pay

## 2020-11-09 ENCOUNTER — Encounter: Payer: Self-pay | Admitting: Primary Care

## 2020-11-09 ENCOUNTER — Ambulatory Visit (INDEPENDENT_AMBULATORY_CARE_PROVIDER_SITE_OTHER): Payer: 59 | Admitting: Primary Care

## 2020-11-09 VITALS — BP 110/68 | HR 71 | Temp 98.3°F | Ht 73.0 in | Wt 217.0 lb

## 2020-11-09 DIAGNOSIS — E538 Deficiency of other specified B group vitamins: Secondary | ICD-10-CM

## 2020-11-09 DIAGNOSIS — K219 Gastro-esophageal reflux disease without esophagitis: Secondary | ICD-10-CM | POA: Diagnosis not present

## 2020-11-09 DIAGNOSIS — Z23 Encounter for immunization: Secondary | ICD-10-CM

## 2020-11-09 DIAGNOSIS — F411 Generalized anxiety disorder: Secondary | ICD-10-CM

## 2020-11-09 DIAGNOSIS — I10 Essential (primary) hypertension: Secondary | ICD-10-CM

## 2020-11-09 DIAGNOSIS — R5383 Other fatigue: Secondary | ICD-10-CM | POA: Diagnosis not present

## 2020-11-09 DIAGNOSIS — Z0001 Encounter for general adult medical examination with abnormal findings: Secondary | ICD-10-CM

## 2020-11-09 DIAGNOSIS — F5101 Primary insomnia: Secondary | ICD-10-CM

## 2020-11-09 MED ORDER — CYANOCOBALAMIN 1000 MCG/ML IJ SOLN
1000.0000 ug | Freq: Once | INTRAMUSCULAR | Status: AC
  Administered 2020-11-09: 1000 ug via INTRAMUSCULAR

## 2020-11-09 MED ORDER — "NEEDLE (DISP) 25G X 1"" MISC": 0 refills | Status: DC

## 2020-11-09 MED ORDER — CYANOCOBALAMIN 1000 MCG/ML IJ SOLN: INTRAMUSCULAR | 0 refills | Status: DC

## 2020-11-09 MED ORDER — "BD ECLIPSE SYRINGE 25G X 5/8"" 3 ML MISC": 0 refills | Status: DC

## 2020-11-09 NOTE — Assessment & Plan Note (Signed)
Doing well on sertraline (Zoloft) 50 mg daily. Continue same.

## 2020-11-09 NOTE — Patient Instructions (Addendum)
Start exercising. You should be getting 150 minutes of moderate intensity exercise weekly.  It's important to improve your diet by reducing consumption of fast food, fried food, processed snack foods, sugary drinks. Increase consumption of fresh vegetables and fruits, whole grains, water.  Ensure you are drinking 64 ounces of water daily.  You have received a B12 injection today.   You should receive weekly B12 injections for another three weeks, then every other week for two weeks, then monthly thereafter.  We will recheck your B12 level after three months, please set up a lab appointment for this time.    Preventive Care 65-47 Years Old, Male Preventive care refers to lifestyle choices and visits with your health care provider that can promote health and wellness. This includes:  A yearly physical exam. This is also called an annual well check.  Regular dental and eye exams.  Immunizations.  Screening for certain conditions.  Healthy lifestyle choices, such as eating a healthy diet, getting regular exercise, not using drugs or products that contain nicotine and tobacco, and limiting alcohol use. What can I expect for my preventive care visit? Physical exam Your health care provider will check:  Height and weight. These may be used to calculate body mass index (BMI), which is a measurement that tells if you are at a healthy weight.  Heart rate and blood pressure.  Your skin for abnormal spots. Counseling Your health care provider may ask you questions about:  Alcohol, tobacco, and drug use.  Emotional well-being.  Home and relationship well-being.  Sexual activity.  Eating habits.  Work and work Statistician. What immunizations do I need?  Influenza (flu) vaccine  This is recommended every year. Tetanus, diphtheria, and pertussis (Tdap) vaccine  You may need a Td booster every 10 years. Varicella (chickenpox) vaccine  You may need this vaccine if you have not  already been vaccinated. Zoster (shingles) vaccine  You may need this after age 58. Measles, mumps, and rubella (MMR) vaccine  You may need at least one dose of MMR if you were born in 1957 or later. You may also need a second dose. Pneumococcal conjugate (PCV13) vaccine  You may need this if you have certain conditions and were not previously vaccinated. Pneumococcal polysaccharide (PPSV23) vaccine  You may need one or two doses if you smoke cigarettes or if you have certain conditions. Meningococcal conjugate (MenACWY) vaccine  You may need this if you have certain conditions. Hepatitis A vaccine  You may need this if you have certain conditions or if you travel or work in places where you may be exposed to hepatitis A. Hepatitis B vaccine  You may need this if you have certain conditions or if you travel or work in places where you may be exposed to hepatitis B. Haemophilus influenzae type b (Hib) vaccine  You may need this if you have certain risk factors. Human papillomavirus (HPV) vaccine  If recommended by your health care provider, you may need three doses over 6 months. You may receive vaccines as individual doses or as more than one vaccine together in one shot (combination vaccines). Talk with your health care provider about the risks and benefits of combination vaccines. What tests do I need? Blood tests  Lipid and cholesterol levels. These may be checked every 5 years, or more frequently if you are over 76 years old.  Hepatitis C test.  Hepatitis B test. Screening  Lung cancer screening. You may have this screening every year starting at  age 25 if you have a 30-pack-year history of smoking and currently smoke or have quit within the past 15 years.  Prostate cancer screening. Recommendations will vary depending on your family history and other risks.  Colorectal cancer screening. All adults should have this screening starting at age 12 and continuing until age  29. Your health care provider may recommend screening at age 28 if you are at increased risk. You will have tests every 1-10 years, depending on your results and the type of screening test.  Diabetes screening. This is done by checking your blood sugar (glucose) after you have not eaten for a while (fasting). You may have this done every 1-3 years.  Sexually transmitted disease (STD) testing. Follow these instructions at home: Eating and drinking  Eat a diet that includes fresh fruits and vegetables, whole grains, lean protein, and low-fat dairy products.  Take vitamin and mineral supplements as recommended by your health care provider.  Do not drink alcohol if your health care provider tells you not to drink.  If you drink alcohol: ? Limit how much you have to 0-2 drinks a day. ? Be aware of how much alcohol is in your drink. In the [place] one drink equals one 12 oz bottle of beer (355 mL), one 5 oz glass of wine (148 mL), or one 1 oz glass of hard liquor (44 mL). Lifestyle  Take daily care of your teeth and gums.  Stay active. Exercise for at least 30 minutes on 5 or more days each week.  Do not use any products that contain nicotine or tobacco, such as cigarettes, e-cigarettes, and chewing tobacco. If you need help quitting, ask your health care provider.  If you are sexually active, practice safe sex. Use a condom or other form of protection to prevent STIs (sexually transmitted infections).  Talk with your health care provider about taking a low-dose aspirin every day starting at age 24. What's next?  Go to your health care provider once a year for a well check visit.  Ask your health care provider how often you should have your eyes and teeth checked.  Stay up to date on all vaccines. This information is not intended to replace advice given to you by your health care provider. Make sure you discuss any questions you have with your health care provider. Document Revised:  11/11/2018 Document Reviewed: 11/11/2018 Elsevier Patient Education  [employer].    Influenza (Flu) Vaccine (Inactivated or Recombinant): What You Need to Know 1. Why get vaccinated? Influenza vaccine can prevent influenza (flu). Flu is a contagious disease that spreads around the [place] every year, usually between October and May. Anyone can get the flu, but it is more dangerous for some people. Infants and young children, people 26 years of age and older, pregnant women, and people with certain health conditions or a weakened immune system are at greatest risk of flu complications. Pneumonia, bronchitis, sinus infections and ear infections are examples of flu-related complications. If you have a medical condition, such as heart disease, cancer or diabetes, flu can make it worse. Flu can cause fever and chills, sore throat, muscle aches, fatigue, cough, headache, and runny or stuffy nose. Some people may have vomiting and diarrhea, though this is more common in children than adults. Each year thousands of people in the [place] die from flu, and many more are hospitalized. Flu vaccine prevents millions of illnesses and flu-related visits to the doctor each year. 2. Influenza vaccine  CDC recommends everyone 20 months of age and older get vaccinated every flu season. Children 6 months through 72 years of age may need 2 doses during a single flu season. Everyone else needs only 1 dose each flu season. It takes about 2 weeks for protection to develop after vaccination. There are many flu viruses, and they are always changing. Each year a new flu vaccine is made to protect against three or four viruses that are likely to cause disease in the upcoming flu season. Even when the vaccine doesn't exactly match these viruses, it may still provide some protection. Influenza vaccine does not cause flu. Influenza vaccine may be given at the same time as other vaccines. 3. Talk with your health  care provider Tell your vaccine provider if the person getting the vaccine:  Has had an allergic reaction after a previous dose of influenza vaccine, or has any severe, life-threatening allergies.  Has ever had Guillain-Barr Syndrome (also called GBS). In some cases, your health care provider may decide to postpone influenza vaccination to a future visit. People with minor illnesses, such as a cold, may be vaccinated. People who are moderately or severely ill should usually wait until they recover before getting influenza vaccine. Your health care provider can give you more information. 4. Risks of a vaccine reaction  Soreness, redness, and swelling where shot is given, fever, muscle aches, and headache can happen after influenza vaccine.  There may be a very small increased risk of Guillain-Barr Syndrome (GBS) after inactivated influenza vaccine (the flu shot). Young children who get the flu shot along with pneumococcal vaccine (PCV13), and/or DTaP vaccine at the same time might be slightly more likely to have a seizure caused by fever. Tell your health care provider if a child who is getting flu vaccine has ever had a seizure. People sometimes faint after medical procedures, including vaccination. Tell your provider if you feel dizzy or have vision changes or ringing in the ears. As with any medicine, there is a very remote chance of a vaccine causing a severe allergic reaction, other serious injury, or death. 5. What if there is a serious problem? An allergic reaction could occur after the vaccinated person leaves the clinic. If you see signs of a severe allergic reaction (hives, swelling of the face and throat, difficulty breathing, a fast heartbeat, dizziness, or weakness), call 9-1-1 and get the person to the nearest hospital. For other signs that concern you, call your health care provider. Adverse reactions should be reported to the Vaccine Adverse Event Reporting System (VAERS). Your  health care provider will usually file this report, or you can do it yourself. Visit the VAERS website at www.vaers.SamedayNews.es or call 346-394-0825.VAERS is only for reporting reactions, and VAERS staff do not give medical advice. 6. The National Vaccine Injury Compensation Program The [employer] Vaccine Injury Compensation Program (VICP) is a federal program that was created to compensate people who may have been injured by certain vaccines. Visit the VICP website at GoldCloset.com.ee or call 401-867-4375 to learn about the program and about filing a claim. There is a time limit to file a claim for compensation. 7. How can I learn more?  Ask your healthcare provider.  Call your local or state health department.  Contact the Centers for Disease Control and Prevention (CDC): ? Call 619-208-5027 (1-800-CDC-INFO) or ? Visit CDC's https://gibson.com/ Vaccine Information Statement (Interim) Inactivated Influenza Vaccine (07/15/2018) This information is not intended to replace advice given to you by your health care  provider. Make sure you discuss any questions you have with your health care provider. Document Revised: 03/08/2019 Document Reviewed: 07/19/2018 Elsevier Patient Education  [employer].

## 2020-11-09 NOTE — Assessment & Plan Note (Signed)
Stable in the office today. Continue amlodipine 5 mg.

## 2020-11-09 NOTE — Assessment & Plan Note (Signed)
Continued with recent level of 152. Oral treatment ineffective.  B12 injection provided today. Continue weekly for another 3 weeks, then every other week for four weeks, then monthly thereafter.  Needs repeat B12 lab in 3 months.   He will bring in his wife next week for demonstration of B12 medication so they can perform this at home. Instructions provided to patient.

## 2020-11-09 NOTE — Addendum Note (Signed)
Addended by: Donnamarie Poag on: 11/09/2020 03:35 PM   Modules accepted: Orders

## 2020-11-09 NOTE — Assessment & Plan Note (Signed)
Doing well on Zoloft, continue same. 

## 2020-11-09 NOTE — Progress Notes (Signed)
Subjective:    Patient ID: Darrell Lawson, male    DOB: March 20, 1971, 49 y.o.   MRN: [id number]  HPI  This visit occurred during the SARS-CoV-2 public health emergency.  Safety protocols were in place, including screening questions prior to the visit, additional usage of staff PPE, and extensive cleaning of exam room while observing appropriate contact time as indicated for disinfecting solutions.   Darrell Lawson is a 49 year old male who presents today for complete physical.  Immunizations: -Tetanus: Completed in 2018 -Influenza: Due -Covid-19: Completed series   Diet: He endorses a fair diet. He has cut out regular soda. Exercise: He is walking at work, active at home  Eye exam: Due Dental exam: Due  Colonoscopy: Completed in 2018, due in 2028 PSA: 0.78 in 2020  BP Readings from Last 3 Encounters:  11/09/20 110/68  11/18/19 130/80  04/20/19 126/80    The 10-year ASCVD risk score Denman George DC Jr., et al., 2013) is: 2.3%   Values used to calculate the score:     Age: 76 years     Sex: Male     Is Non-Hispanic African American: No     Diabetic: No     Tobacco smoker: No     Systolic Blood Pressure: 110 mmHg     Is BP treated: Yes     HDL Cholesterol: 55.4 mg/dL     Total Cholesterol: 189 mg/dL   Review of Systems  Constitutional: Negative for unexpected weight change.  HENT: Negative for rhinorrhea.   Eyes: Negative for visual disturbance.  Respiratory: Negative for cough and shortness of breath.   Cardiovascular: Negative for chest pain.  Gastrointestinal: Negative for constipation and diarrhea.  Genitourinary: Negative for difficulty urinating.  Musculoskeletal: Negative for arthralgias and myalgias.  Skin: Negative for rash.  Allergic/Immunologic: Positive for environmental allergies.  Neurological: Positive for numbness. Negative for dizziness and headaches.  Psychiatric/Behavioral: The patient is not nervous/anxious.        Past Medical History:  Diagnosis Date   . Anxiety   . Colitis   . Diverticulitis large intestine   . Frequent headaches   . Gout      Social History   Socioeconomic History  . Marital status: Married    Spouse name: Not on file  . Number of children: Not on file  . Years of education: Not on file  . Highest education level: Not on file  Occupational History    Employer: [employer]  Tobacco Use  . Smoking status: Never Smoker  . Smokeless tobacco: Never Used  Vaping Use  . Vaping Use: Never used  Substance and Sexual Activity  . Alcohol use: No  . Drug use: No  . Sexual activity: Yes  Other Topics Concern  . Not on file  Social History Narrative  . Not on file   Social Determinants of Health   Financial Resource Strain: Not on file  Food Insecurity: Not on file  Transportation Needs: Not on file  Physical Activity: Not on file  Stress: Not on file  Social Connections: Not on file  Intimate Partner Violence: Not on file    Past Surgical History:  Procedure Laterality Date  . COLONOSCOPY WITH PROPOFOL N/A 11/20/2017   Procedure: COLONOSCOPY WITH PROPOFOL;  Surgeon: Wyline Mood, MD;  Location: [facility];  Service: Gastroenterology;  Laterality: N/A;  . EYE SURGERY Left    age 84    Family History  Problem Relation Age of Onset  .  Hypertension Mother   . Hypertension Father   . Heart disease Father   . Diabetes Father   . Cancer Maternal Grandmother        breast    Allergies  Allergen Reactions  . Morphine And Related     Patient believes that possibly in the past morphine made him itchy.    Current Outpatient Medications on File Prior to Visit  Medication Sig Dispense Refill  . amLODipine (NORVASC) 5 MG tablet Take 1 tablet (5 mg total) by mouth daily. For blood pressure. 90 tablet 3  . sertraline (ZOLOFT) 50 MG tablet Take 1 tablet (50 mg total) by mouth daily. For anxiety. 90 tablet 3  . Zinc 50 MG CAPS Take 1 capsule by mouth daily.     No current  facility-administered medications on file prior to visit.    BP 110/68   Pulse 71   Temp 98.3 F (36.8 C) (Temporal)   Ht 6\' 1"  (1.854 m)   Wt 217 lb (98.4 kg)   SpO2 98%   BMI 28.63 kg/m    Objective:   Physical Exam Constitutional:      Appearance: He is well-nourished.  HENT:     Right Ear: Tympanic membrane and ear canal normal.     Left Ear: Tympanic membrane and ear canal normal.     Mouth/Throat:     Mouth: Oropharynx is clear and moist.  Eyes:     Extraocular Movements: EOM normal.     Pupils: Pupils are equal, round, and reactive to light.  Cardiovascular:     Rate and Rhythm: Normal rate and regular rhythm.  Pulmonary:     Effort: Pulmonary effort is normal.     Breath sounds: Normal breath sounds.  Abdominal:     General: Bowel sounds are normal.     Palpations: Abdomen is soft.     Tenderness: There is no abdominal tenderness.  Musculoskeletal:        General: Normal range of motion.     Cervical back: Neck supple.  Skin:    General: Skin is warm and dry.  Neurological:     Mental Status: He is alert and oriented to person, place, and time.     Cranial Nerves: No cranial nerve deficit.     Deep Tendon Reflexes:     Reflex Scores:      Patellar reflexes are 2+ on the right side and 2+ on the left side. Psychiatric:        Mood and Affect: Mood and affect and mood normal.            Assessment & Plan:

## 2020-11-09 NOTE — Assessment & Plan Note (Signed)
Denies symptoms

## 2020-11-09 NOTE — Assessment & Plan Note (Signed)
Immunizations UTD. Colonoscopy UTD, due in 2028.  Discussed the importance of a healthy diet and regular exercise in order for weight loss, and to reduce the risk of any potential medical problems.  Exam today unremarkable. Labs pending.

## 2020-11-09 NOTE — Assessment & Plan Note (Signed)
Chronic, recent b12 level of 152, not on oral supplementation as it was ineffective.  Will replenish B12, see notes under B12 deficiency.

## 2020-11-09 NOTE — Telephone Encounter (Signed)
Pt in office today. Per PCP pt is to continue B12 inj and wants wife to learn how to give inj. Set up education for pt and wife and send in B12 and supplies to [facility].  Sent in script and placed pt on scheduled for 12/17 for [place].

## 2020-11-14 ENCOUNTER — Other Ambulatory Visit: Payer: Self-pay | Admitting: Primary Care

## 2020-11-14 DIAGNOSIS — E538 Deficiency of other specified B group vitamins: Secondary | ICD-10-CM

## 2020-11-16 ENCOUNTER — Ambulatory Visit: Payer: 59

## 2020-11-16 ENCOUNTER — Other Ambulatory Visit: Payer: Self-pay

## 2020-11-16 ENCOUNTER — Other Ambulatory Visit: Payer: 59

## 2020-11-19 NOTE — Telephone Encounter (Signed)
Called and left voicemail for patient to return call to office to re-schedule B12 injection RN teaching.

## 2020-11-28 ENCOUNTER — Other Ambulatory Visit: Payer: 59

## 2020-12-07 ENCOUNTER — Encounter: Payer: 59 | Admitting: Primary Care

## 2020-12-24 ENCOUNTER — Other Ambulatory Visit: Payer: Self-pay | Admitting: Primary Care

## 2020-12-24 DIAGNOSIS — E538 Deficiency of other specified B group vitamins: Secondary | ICD-10-CM

## 2020-12-31 ENCOUNTER — Other Ambulatory Visit: Payer: Self-pay | Admitting: Primary Care

## 2020-12-31 DIAGNOSIS — F411 Generalized anxiety disorder: Secondary | ICD-10-CM

## 2020-12-31 DIAGNOSIS — I10 Essential (primary) hypertension: Secondary | ICD-10-CM

## 2021-03-29 ENCOUNTER — Other Ambulatory Visit: Payer: Self-pay | Admitting: Primary Care

## 2021-03-29 DIAGNOSIS — F411 Generalized anxiety disorder: Secondary | ICD-10-CM

## 2021-03-29 DIAGNOSIS — I10 Essential (primary) hypertension: Secondary | ICD-10-CM

## 2021-03-30 ENCOUNTER — Other Ambulatory Visit: Payer: Self-pay | Admitting: Primary Care

## 2021-03-30 DIAGNOSIS — E538 Deficiency of other specified B group vitamins: Secondary | ICD-10-CM

## 2021-03-30 NOTE — Telephone Encounter (Signed)
Patient is overdue for repeat B12 lab, he should have had this done in February 2022. Needs lab appt for B12 before we can provide any additional B12 injections.

## 2021-04-01 NOTE — Telephone Encounter (Signed)
Have called patient l/m to call office for app. Can you try to reach him later?

## 2021-05-14 NOTE — Telephone Encounter (Signed)
LVM to call back.

## 2021-10-22 ENCOUNTER — Encounter: Payer: Self-pay | Admitting: Primary Care

## 2021-10-22 ENCOUNTER — Ambulatory Visit (INDEPENDENT_AMBULATORY_CARE_PROVIDER_SITE_OTHER): Payer: 59 | Admitting: Primary Care

## 2021-10-22 ENCOUNTER — Other Ambulatory Visit: Payer: Self-pay

## 2021-10-22 VITALS — BP 136/74 | HR 82 | Temp 98.1°F | Ht 73.0 in | Wt 221.1 lb

## 2021-10-22 DIAGNOSIS — Z125 Encounter for screening for malignant neoplasm of prostate: Secondary | ICD-10-CM

## 2021-10-22 DIAGNOSIS — F5101 Primary insomnia: Secondary | ICD-10-CM

## 2021-10-22 DIAGNOSIS — Z23 Encounter for immunization: Secondary | ICD-10-CM | POA: Diagnosis not present

## 2021-10-22 DIAGNOSIS — I10 Essential (primary) hypertension: Secondary | ICD-10-CM

## 2021-10-22 DIAGNOSIS — K219 Gastro-esophageal reflux disease without esophagitis: Secondary | ICD-10-CM | POA: Diagnosis not present

## 2021-10-22 DIAGNOSIS — E538 Deficiency of other specified B group vitamins: Secondary | ICD-10-CM | POA: Diagnosis not present

## 2021-10-22 DIAGNOSIS — F411 Generalized anxiety disorder: Secondary | ICD-10-CM

## 2021-10-22 DIAGNOSIS — Z0000 Encounter for general adult medical examination without abnormal findings: Secondary | ICD-10-CM

## 2021-10-22 DIAGNOSIS — R351 Nocturia: Secondary | ICD-10-CM

## 2021-10-22 LAB — CBC
HCT: 41.7 % (ref 39.0–52.0)
Hemoglobin: 14.1 g/dL (ref 13.0–17.0)
MCHC: 33.9 g/dL (ref 30.0–36.0)
MCV: 93 fl (ref 78.0–100.0)
Platelets: 327 10*3/uL (ref 150.0–400.0)
RBC: 4.48 Mil/uL (ref 4.22–5.81)
RDW: 12.5 % (ref 11.5–15.5)
WBC: 5.2 10*3/uL (ref 4.0–10.5)

## 2021-10-22 LAB — COMPREHENSIVE METABOLIC PANEL
ALT: 19 U/L (ref 0–53)
AST: 19 U/L (ref 0–37)
Albumin: 4.7 g/dL (ref 3.5–5.2)
Alkaline Phosphatase: 53 U/L (ref 39–117)
BUN: 14 mg/dL (ref 6–23)
CO2: 29 mEq/L (ref 19–32)
Calcium: 9.5 mg/dL (ref 8.4–10.5)
Chloride: 100 mEq/L (ref 96–112)
Creatinine, Ser: 1 mg/dL (ref 0.40–1.50)
GFR: 87.69 mL/min (ref >60.00)
Glucose, Bld: 100 mg/dL — ABNORMAL HIGH (ref 70–99)
Potassium: 4.2 mEq/L (ref 3.5–5.1)
Sodium: 136 mEq/L (ref 135–145)
Total Bilirubin: 0.6 mg/dL (ref 0.2–1.2)
Total Protein: 6.9 g/dL (ref 6.0–8.3)

## 2021-10-22 LAB — LIPID PANEL
Cholesterol: 196 mg/dL (ref 0–200)
HDL: 52.2 mg/dL (ref >39.00)
LDL Cholesterol: 118 mg/dL — ABNORMAL HIGH (ref 0–99)
NonHDL: 144.26
Total CHOL/HDL Ratio: 4
Triglycerides: 133 mg/dL (ref 0.0–149.0)
VLDL: 26.6 mg/dL (ref 0.0–40.0)

## 2021-10-22 LAB — PSA: PSA: 0.87 ng/mL (ref 0.10–4.00)

## 2021-10-22 LAB — VITAMIN B12: Vitamin B-12: 204 pg/mL — ABNORMAL LOW (ref 211–911)

## 2021-10-22 MED ORDER — TAMSULOSIN HCL 0.4 MG PO CAPS: 0.4000 mg | ORAL_CAPSULE | Freq: Every day | ORAL | 0 refills | Status: AC

## 2021-10-22 NOTE — Assessment & Plan Note (Signed)
Doing well on Zoloft 50 mg, continue same.  

## 2021-10-22 NOTE — Assessment & Plan Note (Addendum)
Continued. He agrees for treatment. Rx for tamsulosin 0.4 mg sen to pharmacy. He will update.   Consider sleep study.

## 2021-10-22 NOTE — Assessment & Plan Note (Signed)
Shingrix and influenza due and provided today. Colonoscopy UTD, due 2028. PSA due and pending.  Discussed the importance of a healthy diet and regular exercise in order for weight loss, and to reduce the risk of further co-morbidity.  Exam today stable. Labs pending.

## 2021-10-22 NOTE — Assessment & Plan Note (Signed)
Overall stable, infrequent, no recurrent OTC use.   Continue to monitor.

## 2021-10-22 NOTE — Addendum Note (Signed)
Addended by: Erby Pian on: 10/22/2021 08:31 AM   Modules accepted: Orders

## 2021-10-22 NOTE — Assessment & Plan Note (Addendum)
Overall okay today, recommended he start monitoring at home. Continue amlodipine 5 mg daily.

## 2021-10-22 NOTE — Assessment & Plan Note (Signed)
Denies concerns, continue Zoloft 50 mg.

## 2021-10-22 NOTE — Patient Instructions (Signed)
Stop by the lab prior to leaving today. I will notify you of your results once received.   Schedule a nurse visit to repeat your second shingles vaccine for 2-6 months from now.  Start tamsulosin 0.4 mg once daily for urinary frequency at night. Please update me via MyChart in a few weeks.  It was a pleasure to see you today!  Preventive Care 23-50 Years Old, Male Preventive care refers to lifestyle choices and visits with your health care provider that can promote health and wellness. Preventive care visits are also called wellness exams. What can I expect for my preventive care visit? Counseling During your preventive care visit, your health care provider may ask about your: Medical history, including: Past medical problems. Family medical history. Current health, including: Emotional well-being. Home life and relationship well-being. Sexual activity. Lifestyle, including: Alcohol, nicotine or tobacco, and drug use. Access to firearms. Diet, exercise, and sleep habits. Safety issues such as seatbelt and bike helmet use. Sunscreen use. Work and work Astronomer. Physical exam Your health care provider will check your: Height and weight. These may be used to calculate your BMI (body mass index). BMI is a measurement that tells if you are at a healthy weight. Waist circumference. This measures the distance around your waistline. This measurement also tells if you are at a healthy weight and may help predict your risk of certain diseases, such as type 2 diabetes and high blood pressure. Heart rate and blood pressure. Body temperature. Skin for abnormal spots. What immunizations do I need? Vaccines are usually given at various ages, according to a schedule. Your health care provider will recommend vaccines for you based on your age, medical history, and lifestyle or other factors, such as travel or where you work. What tests do I need? Screening Your health care provider may  recommend screening tests for certain conditions. This may include: Lipid and cholesterol levels. Diabetes screening. This is done by checking your blood sugar (glucose) after you have not eaten for a while (fasting). Hepatitis B test. Hepatitis C test. HIV (human immunodeficiency virus) test. STI (sexually transmitted infection) testing, if you are at risk. Lung cancer screening. Prostate cancer screening. Colorectal cancer screening. Talk with your health care provider about your test results, treatment options, and if necessary, the need for more tests. Follow these instructions at home: Eating and drinking  Eat a diet that includes fresh fruits and vegetables, whole grains, lean protein, and low-fat dairy products. Take vitamin and mineral supplements as recommended by your health care provider. Do not drink alcohol if your health care provider tells you not to drink. If you drink alcohol: Limit how much you have to 0-2 drinks a day. Know how much alcohol is in your drink. In the [place] one drink equals one 12 oz bottle of beer (355 mL), one 5 oz glass of wine (148 mL), or one 1 oz glass of hard liquor (44 mL). Lifestyle Brush your teeth every morning and night with fluoride toothpaste. Floss one time each day. Exercise for at least 30 minutes 5 or more days each week. Do not use any products that contain nicotine or tobacco. These products include cigarettes, chewing tobacco, and vaping devices, such as e-cigarettes. If you need help quitting, ask your health care provider. Do not use drugs. If you are sexually active, practice safe sex. Use a condom or other form of protection to prevent STIs. Take aspirin only as told by your health care provider. Make sure that you  understand how much to take and what form to take. Work with your health care provider to find out whether it is safe and beneficial for you to take aspirin daily. Find healthy ways to manage stress, such  as: Meditation, yoga, or listening to music. Journaling. Talking to a trusted person. Spending time with friends and family. Minimize exposure to UV radiation to reduce your risk of skin cancer. Safety Always wear your seat belt while driving or riding in a vehicle. Do not drive: If you have been drinking alcohol. Do not ride with someone who has been drinking. When you are tired or distracted. While texting. If you have been using any mind-altering substances or drugs. Wear a helmet and other protective equipment during sports activities. If you have firearms in your house, make sure you follow all gun safety procedures. What's next? Go to your health care provider once a year for an annual wellness visit. Ask your health care provider how often you should have your eyes and teeth checked. Stay up to date on all vaccines. This information is not intended to replace advice given to you by your health care provider. Make sure you discuss any questions you have with your health care provider. Document Revised: 05/15/2021 Document Reviewed: 05/15/2021 Elsevier Patient Education  2022 [employer].

## 2021-10-22 NOTE — Progress Notes (Signed)
Subjective:    Patient ID: Darrell Lawson, male    DOB: Oct 21, 1971, 50 y.o.   MRN: [id number]  HPI  Darrell Lawson is a very pleasant 50 y.o. male who presents today for complete physical and follow up of chronic conditions.  He would also like to mention nocturia, is getting up about 5 tines nightly on average for the last 2-3 years. He does snore at night, has a deviated septum.   Immunizations: -Tetanus: 2018 -Influenza: Due -Covid-19: 2 vaccines  -Shingles: Never completed   Diet: Fair diet. He is reducing soda intake.  Exercise: No regular exercise. Active at work and home.   Eye exam: Due, he will schedule.   Dental exam: Completes annually   Colonoscopy: Completed in 2018, due 2028  PSA: Due  BP Readings from Last 3 Encounters:  10/22/21 136/74  11/09/20 110/68  11/18/19 130/80        Review of Systems  Constitutional:  Negative for unexpected weight change.  HENT:  Negative for rhinorrhea.   Respiratory:  Negative for cough and shortness of breath.   Cardiovascular:  Negative for chest pain.  Gastrointestinal:  Negative for constipation and diarrhea.  Genitourinary:  Negative for difficulty urinating.       Nocturia, see HPI  Musculoskeletal:  Negative for arthralgias and myalgias.  Skin:  Negative for rash.  Allergic/Immunologic: Negative for environmental allergies.  Neurological:  Negative for dizziness, numbness and headaches.  Psychiatric/Behavioral:  The patient is not nervous/anxious.         Past Medical History:  Diagnosis Date   Anxiety    Colitis    Diverticulitis large intestine    Diverticulitis of colon 10/12/2013   Frequent headaches    Gout     Social History   Socioeconomic History   Marital status: Married    Spouse name: Not on file   Number of children: Not on file   Years of education: Not on file   Highest education level: Not on file  Occupational History    Employer: [employer]  Tobacco Use    Smoking status: Never   Smokeless tobacco: Never  Vaping Use   Vaping Use: Never used  Substance and Sexual Activity   Alcohol use: No   Drug use: No   Sexual activity: Yes  Other Topics Concern   Not on file  Social History Narrative   Not on file   Social Determinants of Health   Financial Resource Strain: Not on file  Food Insecurity: Not on file  Transportation Needs: Not on file  Physical Activity: Not on file  Stress: Not on file  Social Connections: Not on file  Intimate Partner Violence: Not on file    Past Surgical History:  Procedure Laterality Date   COLONOSCOPY WITH PROPOFOL N/A 11/20/2017   Procedure: COLONOSCOPY WITH PROPOFOL;  Surgeon: Wyline Mood, MD;  Location: [facility] ENDOSCOPY;  Service: Gastroenterology;  Laterality: N/A;   EYE SURGERY Left    age 88    Family History  Problem Relation Age of Onset   Hypertension Mother    Hypertension Father    Heart disease Father    Diabetes Father    Cancer Maternal Grandmother        breast    Allergies  Allergen Reactions   Morphine And Related     Patient believes that possibly in the past morphine made him itchy.    Current Outpatient Medications on File Prior to Visit  Medication Sig Dispense Refill   amLODipine (NORVASC) 5 MG tablet TAKE 1 TABLET BY MOUTH EVERY DAY FOR BLOOD PRESSURE 90 tablet 2   sertraline (ZOLOFT) 50 MG tablet TAKE 1 TABLET (50 MG TOTAL) BY MOUTH DAILY. FOR ANXIETY. 90 tablet 2   Zinc 50 MG CAPS Take 1 capsule by mouth daily.     No current facility-administered medications on file prior to visit.    BP 136/74   Pulse 82   Temp 98.1 F (36.7 C) (Temporal)   Ht 6\' 1"  (1.854 m)   Wt 221 lb 1 oz (100.3 kg)   SpO2 98%   BMI 29.17 kg/m  Objective:   Physical Exam HENT:     Right Ear: Tympanic membrane and ear canal normal.     Left Ear: Tympanic membrane and ear canal normal.     Nose: Nose normal.     Right Sinus: No maxillary sinus tenderness or frontal sinus  tenderness.     Left Sinus: No maxillary sinus tenderness or frontal sinus tenderness.  Eyes:     Conjunctiva/sclera: Conjunctivae normal.  Neck:     Thyroid: No thyromegaly.     Vascular: No carotid bruit.  Cardiovascular:     Rate and Rhythm: Normal rate and regular rhythm.     Heart sounds: Normal heart sounds.  Pulmonary:     Effort: Pulmonary effort is normal.     Breath sounds: Normal breath sounds. No wheezing or rales.  Abdominal:     General: Bowel sounds are normal.     Palpations: Abdomen is soft.     Tenderness: There is no abdominal tenderness.  Musculoskeletal:        General: Normal range of motion.     Cervical back: Neck supple.  Skin:    General: Skin is warm and dry.  Neurological:     Mental Status: He is alert and oriented to person, place, and time.     Cranial Nerves: No cranial nerve deficit.     Deep Tendon Reflexes: Reflexes are normal and symmetric.  Psychiatric:        Mood and Affect: Mood normal.          Assessment & Plan:      This visit occurred during the SARS-CoV-2 public health emergency.  Safety protocols were in place, including screening questions prior to the visit, additional usage of staff PPE, and extensive cleaning of exam room while observing appropriate contact time as indicated for disinfecting solutions.

## 2021-10-22 NOTE — Assessment & Plan Note (Signed)
No longer on oral treatment.  Repeat B12 level pending.

## 2021-11-05 ENCOUNTER — Other Ambulatory Visit: Payer: Self-pay | Admitting: Primary Care

## 2021-11-05 DIAGNOSIS — R351 Nocturia: Secondary | ICD-10-CM

## 2021-11-05 NOTE — Telephone Encounter (Signed)
Yes, please find out why the pharmacy is requesting a refill too soon. Also the patient was instructed to update me a few weeks after initiation on 10/22/21.

## 2021-11-05 NOTE — Telephone Encounter (Signed)
Called pharmacy to see why requesting refill if just called in. At lunch will need to call back after 2pm

## 2021-11-08 NOTE — Telephone Encounter (Signed)
Spoke to pharmacy they have received script that was sent by our office on 12/6. Not sure why was resent. Will close pending request on their end.

## 2021-12-23 ENCOUNTER — Other Ambulatory Visit: Payer: Self-pay | Admitting: Primary Care

## 2021-12-23 DIAGNOSIS — I10 Essential (primary) hypertension: Secondary | ICD-10-CM

## 2022-03-11 ENCOUNTER — Ambulatory Visit (INDEPENDENT_AMBULATORY_CARE_PROVIDER_SITE_OTHER): Payer: 59

## 2022-03-11 DIAGNOSIS — Z23 Encounter for immunization: Secondary | ICD-10-CM

## 2022-03-11 NOTE — Progress Notes (Signed)
Pt came in today to receive his shinglex injection pt was given injection in his Left deltoid successfully w/o any concerns   ?

## 2022-04-08 NOTE — Telephone Encounter (Signed)
Pt called back and got appt tomorrow ?

## 2022-04-08 NOTE — Telephone Encounter (Signed)
Pts wife called back and said he really cant wait a week because he is still having nausea and pain. Per pts other note: "Also I am still nauseated and the pain is on my left side as always I know you treated my diverticulitis with antibiotics before and it helped. Thank you" pts wife asked for a call back at [phone]  ?

## 2022-04-09 ENCOUNTER — Ambulatory Visit: Payer: 59 | Admitting: Primary Care

## 2022-04-09 ENCOUNTER — Encounter: Payer: Self-pay | Admitting: Primary Care

## 2022-04-09 DIAGNOSIS — R1032 Left lower quadrant pain: Secondary | ICD-10-CM | POA: Diagnosis not present

## 2022-04-09 DIAGNOSIS — R911 Solitary pulmonary nodule: Secondary | ICD-10-CM

## 2022-04-09 MED ORDER — CIPROFLOXACIN HCL 500 MG PO TABS: 500.0000 mg | ORAL_TABLET | Freq: Two times a day (BID) | ORAL | 0 refills | Status: AC

## 2022-04-09 MED ORDER — METRONIDAZOLE 500 MG PO TABS: 500.0000 mg | ORAL_TABLET | Freq: Three times a day (TID) | ORAL | 0 refills | Status: AC

## 2022-04-09 NOTE — Assessment & Plan Note (Signed)
Symptoms consistent for acute diverticulitis, although there may be some form of acute or chronic colitis involved. ? ?Reviewed colonoscopy from 2018, office notes/imaging/labs from ED visit through care everywhere. ? ?Given no improvement in symptoms, coupled with exam and CT findings, will proceed with treatment for diverticulitis with Cipro and Flagyl courses.  He has tolerated this course well previously. ? ?I will also reach out to his GI doctor regarding his recent CT scan findings to learn if we need to repeat colonoscopy. ? ?He will update in a few days in the improvement. ?

## 2022-04-09 NOTE — Progress Notes (Signed)
? ?Subjective:  ? ? Patient ID: Darrell Lawson, male    DOB: 1971/06/13, 51 y.o.   MRN: [id number] ? ?HPI ? ?Darrell Lawson is a very pleasant 51 y.o. male with a history of hypertension, fatigue, vitamin B12 deficiency, diverticulitis who presents today  ? ?He presented to [facility] ED on 04/05/2022 with reports of rectal bleeding, 4 episodes of bloody stools earlier that day, bright red that filled the toilet.  He also endorsed left lower quadrant and suprapubic abdominal pain. ? ?During his stay in the ED he underwent CT abdomen pelvis which did not reveal evidence of GI bleed.  It was noted he had borderline thickening of the descending and sigmoid colon, extensive colonic diverticulosis, possible infectious/inflammatory colitis, no bowel obstruction, left lower lobe 6 mm pulmonary nodule with follow-up CT chest recommended in 6 months.  ED provider notes revealed that he did not have diverticulitis.  Labs were stable.  He was discharged home later that evening with recommendations for repeat colonoscopy and PCP follow-up. ? ?Since his discharge home he continues to notice left lower quadrant abdomen with radiation to the suprapubic region. His pain is no worse but no better. He's also nauseated without vomiting, especially after meals. He denies fevers, rectal bleeding, vomiting, diarrhea, unexplained weight loss, family history of Crohn's, ulcerative colitis, colon cancer. He's been eating per his usual. He has noticed feeling weak at times during the day for the last month.  ? ?His last colonoscopy was in 2018 which revealed diverticulosis throughout and internal nonbleeding hemorrhoids, recall due in 2028. ? ? ? ?Review of Systems  ?Constitutional:  Positive for fatigue. Negative for fever.  ?Gastrointestinal:  Positive for abdominal pain and nausea. Negative for blood in stool, constipation, diarrhea and vomiting.  ? ?   ? ? ?Past Medical History:  ?Diagnosis Date  ? Anxiety   ? Colitis   ? Diverticulitis  large intestine   ? Diverticulitis of colon 10/12/2013  ? Frequent headaches   ? Gout   ? ? ?Social History  ? ?Socioeconomic History  ? Marital status: Married  ?  Spouse name: Not on file  ? Number of children: Not on file  ? Years of education: Not on file  ? Highest education level: Not on file  ?Occupational History  ?  Employer: [employer]  ?Tobacco Use  ? Smoking status: Never  ? Smokeless tobacco: Never  ?Vaping Use  ? Vaping Use: Never used  ?Substance and Sexual Activity  ? Alcohol use: No  ? Drug use: No  ? Sexual activity: Yes  ?Other Topics Concern  ? Not on file  ?Social History Narrative  ? Not on file  ? ?Social Determinants of Health  ? ?Financial Resource Strain: Not on file  ?Food Insecurity: Not on file  ?Transportation Needs: Not on file  ?Physical Activity: Not on file  ?Stress: Not on file  ?Social Connections: Not on file  ?Intimate Partner Violence: Not on file  ? ? ?Past Surgical History:  ?Procedure Laterality Date  ? COLONOSCOPY WITH PROPOFOL N/A 11/20/2017  ? Procedure: COLONOSCOPY WITH PROPOFOL;  Surgeon: Jonathon Bellows, MD;  Location: [facility] ENDOSCOPY;  Service: Gastroenterology;  Laterality: N/A;  ? EYE SURGERY Left   ? age 41  ? ? ?Family History  ?Problem Relation Age of Onset  ? Hypertension Mother   ? Hypertension Father   ? Heart disease Father   ? Diabetes Father   ? Cancer Maternal Grandmother   ?  breast  ? ? ?Allergies  ?Allergen Reactions  ? Morphine And Related   ?  Patient believes that possibly in the past morphine made him itchy.  ? ? ?Current Outpatient Medications on File Prior to Visit  ?Medication Sig Dispense Refill  ? amLODipine (NORVASC) 5 MG tablet TAKE 1 TABLET BY MOUTH EVERY DAY FOR BLOOD PRESSURE 90 tablet 2  ? sertraline (ZOLOFT) 50 MG tablet TAKE 1 TABLET (50 MG TOTAL) BY MOUTH DAILY. FOR ANXIETY. 90 tablet 2  ? tamsulosin (FLOMAX) 0.4 MG CAPS capsule Take 1 capsule (0.4 mg total) by mouth daily. For urinary frequency (Patient not taking: Reported  on 04/09/2022) 30 capsule 0  ? Zinc 50 MG CAPS Take 1 capsule by mouth daily. (Patient not taking: Reported on 04/09/2022)    ? ?No current facility-administered medications on file prior to visit.  ? ? ?BP 134/74   Pulse 88   Temp 98.6 ?F (37 ?C) (Oral)   Ht 6\' 1"  (1.854 m)   Wt 225 lb (102.1 kg)   SpO2 98%   BMI 29.69 kg/m?  ?Objective:  ? Physical Exam ?Cardiovascular:  ?   Rate and Rhythm: Normal rate and regular rhythm.  ?Pulmonary:  ?   Effort: Pulmonary effort is normal.  ?   Breath sounds: Normal breath sounds. No wheezing or rales.  ?Abdominal:  ?   General: Bowel sounds are normal.  ?   Palpations: Abdomen is soft.  ?   Tenderness: There is abdominal tenderness in the suprapubic area and left lower quadrant.  ?Musculoskeletal:  ?   Cervical back: Neck supple.  ?Skin: ?   General: Skin is warm and dry.  ?Neurological:  ?   Mental Status: He is alert and oriented to person, place, and time.  ? ? ? ? ? ?   ?Assessment & Plan:  ? ? ? ? ?This visit occurred during the SARS-CoV-2 public health emergency.  Safety protocols were in place, including screening questions prior to the visit, additional usage of staff PPE, and extensive cleaning of exam room while observing appropriate contact time as indicated for disinfecting solutions.  ?

## 2022-04-09 NOTE — Assessment & Plan Note (Signed)
6 mm to left lower lobe, incidental finding from recent CT abdomen/pelvis.  Discussed results with patient today. ? ?Repeat CT chest in 6 months, around early November 2023. ?

## 2022-04-09 NOTE — Patient Instructions (Signed)
Start ciprofloxacin antibiotics.  Take 1 tablet mouth twice daily for 7 days. ? ?Start metronidazole antibiotics.  Take 1 tablet mouth 3 times daily for 7 days. ? ?Please notify me if no improvement within 3 to 4 days. ? ?You are due for repeat CT chest in 6 months. ? ?I will be in touch again soon. ? ?It was a pleasure to see you today! ? ?

## 2022-04-10 ENCOUNTER — Telehealth: Payer: Self-pay

## 2022-04-10 DIAGNOSIS — K625 Hemorrhage of anus and rectum: Secondary | ICD-10-CM

## 2022-04-10 MED ORDER — NA SULFATE-K SULFATE-MG SULF 17.5-3.13-1.6 GM/177ML PO SOLN: 1.0000 | Freq: Once | ORAL | 0 refills | Status: AC

## 2022-04-10 NOTE — Telephone Encounter (Signed)
Gastroenterology Pre-Procedure Review ? ?Request Date: 04/16/22 ?Requesting Physician: Dr. Vicente Males ? ?PATIENT REVIEW QUESTIONS: The patient responded to the following health history questions as indicated:   ? ?1. Are you having any GI issues? yes (Rectal bleeding, nauseated since rectal bleeding started around last week) ?2. Do you have a personal history of Polyps? no ?3. Do you have a family history of Colon Cancer or Polyps? yes (father colon polyps) ?4. Diabetes Mellitus? no ?5. Joint replacements in the past 12 months?no ?6. Major health problems in the past 3 months?yes (ER Visit 05/06/203 Abdominal pain , LLG and Rectal Bleed) ?7. Any artificial heart valves, MVP, or defibrillator?no ?   ?MEDICATIONS & ALLERGIES:    ?Patient reports the following regarding taking any anticoagulation/antiplatelet therapy:   ?Plavix, Coumadin, Eliquis, Xarelto, Lovenox, Pradaxa, Brilinta, or Effient? no ?Aspirin? no ? ?Patient confirms/reports the following medications:  ?Current Outpatient Medications  ?Medication Sig Dispense Refill  ? amLODipine (NORVASC) 5 MG tablet TAKE 1 TABLET BY MOUTH EVERY DAY FOR BLOOD PRESSURE 90 tablet 2  ? ciprofloxacin (CIPRO) 500 MG tablet Take 1 tablet (500 mg total) by mouth 2 (two) times daily for 7 days. 14 tablet 0  ? metroNIDAZOLE (FLAGYL) 500 MG tablet Take 1 tablet (500 mg total) by mouth 3 (three) times daily for 7 days. 21 tablet 0  ? sertraline (ZOLOFT) 50 MG tablet TAKE 1 TABLET (50 MG TOTAL) BY MOUTH DAILY. FOR ANXIETY. 90 tablet 2  ? tamsulosin (FLOMAX) 0.4 MG CAPS capsule Take 1 capsule (0.4 mg total) by mouth daily. For urinary frequency (Patient not taking: Reported on 04/09/2022) 30 capsule 0  ? Zinc 50 MG CAPS Take 1 capsule by mouth daily. (Patient not taking: Reported on 04/09/2022)    ? ?No current facility-administered medications for this visit.  ? ? ?Patient confirms/reports the following allergies:  ?Allergies  ?Allergen Reactions  ? Morphine And Related   ?  Patient  believes that possibly in the past morphine made him itchy.  ? ? ?No orders of the defined types were placed in this encounter. ? ? ?AUTHORIZATION INFORMATION ?Primary Insurance: ?1D#: ?Group #: ? ?Secondary Insurance: ?1D#: ?Group #: ? ?SCHEDULE INFORMATION: ?Date: 04/16/22 ?Time: ?Location: [facility] ?

## 2022-04-15 ENCOUNTER — Other Ambulatory Visit: Payer: Self-pay

## 2022-04-15 ENCOUNTER — Encounter: Payer: Self-pay | Admitting: Gastroenterology

## 2022-04-15 DIAGNOSIS — K625 Hemorrhage of anus and rectum: Secondary | ICD-10-CM

## 2022-04-16 ENCOUNTER — Encounter: Payer: Self-pay | Admitting: Gastroenterology

## 2022-04-16 ENCOUNTER — Ambulatory Visit
Admission: RE | Admit: 2022-04-16 | Discharge: 2022-04-16 | Disposition: A | Discharge status: Home / Self Care | Payer: 59 | Attending: Gastroenterology | Admitting: Gastroenterology

## 2022-04-16 ENCOUNTER — Encounter
Admission: RE | Disposition: A | Discharge status: Home / Self Care | Payer: Self-pay | Source: Home / Self Care | Attending: Gastroenterology

## 2022-04-16 ENCOUNTER — Ambulatory Visit: Payer: 59 | Admitting: Certified Registered"

## 2022-04-16 DIAGNOSIS — K5730 Diverticulosis of large intestine without perforation or abscess without bleeding: Secondary | ICD-10-CM | POA: Diagnosis not present

## 2022-04-16 DIAGNOSIS — I10 Essential (primary) hypertension: Secondary | ICD-10-CM | POA: Insufficient documentation

## 2022-04-16 DIAGNOSIS — K625 Hemorrhage of anus and rectum: Secondary | ICD-10-CM

## 2022-04-16 DIAGNOSIS — K529 Noninfective gastroenteritis and colitis, unspecified: Secondary | ICD-10-CM | POA: Diagnosis not present

## 2022-04-16 DIAGNOSIS — K635 Polyp of colon: Secondary | ICD-10-CM

## 2022-04-16 DIAGNOSIS — K640 First degree hemorrhoids: Secondary | ICD-10-CM | POA: Diagnosis not present

## 2022-04-16 DIAGNOSIS — K219 Gastro-esophageal reflux disease without esophagitis: Secondary | ICD-10-CM | POA: Diagnosis not present

## 2022-04-16 HISTORY — DX: Essential (primary) hypertension: I10

## 2022-04-16 HISTORY — PX: COLONOSCOPY WITH PROPOFOL: SHX5780

## 2022-04-16 SURGERY — COLONOSCOPY WITH PROPOFOL
Anesthesia: General

## 2022-04-16 MED ORDER — SODIUM CHLORIDE 0.9 % IV SOLN: INTRAVENOUS | Status: DC

## 2022-04-16 MED ORDER — LIDOCAINE HCL (CARDIAC) PF 100 MG/5ML IV SOSY
PREFILLED_SYRINGE | INTRAVENOUS | Status: DC | PRN
  Administered 2022-04-16: 100 mg via INTRAVENOUS

## 2022-04-16 MED ORDER — PROPOFOL 10 MG/ML IV BOLUS
INTRAVENOUS | Status: DC | PRN
  Administered 2022-04-16: 80 mg via INTRAVENOUS
  Administered 2022-04-16: 20 mg via INTRAVENOUS

## 2022-04-16 MED ORDER — PROPOFOL 500 MG/50ML IV EMUL
INTRAVENOUS | Status: DC | PRN
Start: 2022-04-16 — End: 2022-04-16
  Administered 2022-04-16: 165 ug/kg/min via INTRAVENOUS

## 2022-04-16 MED ORDER — EPHEDRINE SULFATE (PRESSORS) 50 MG/ML IJ SOLN
INTRAMUSCULAR | Status: DC | PRN
  Administered 2022-04-16: 5 mg via INTRAVENOUS

## 2022-04-16 NOTE — Anesthesia Preprocedure Evaluation (Signed)
Anesthesia Evaluation  ?Patient identified by MRN, date of birth, ID band ?Patient awake ? ? ? ?Reviewed: ?Allergy & Precautions, H&P , NPO status , Patient's Chart, lab work & pertinent test results, reviewed documented beta blocker date and time  ? ?Airway ?Mallampati: II ? ? ?Neck ROM: full ? ? ? Dental ? ?(+) Poor Dentition ?  ?Pulmonary ?neg pulmonary ROS,  ?  ?Pulmonary exam normal ? ? ? ? ? ? ? Cardiovascular ?Exercise Tolerance: Good ?hypertension, On Medications ?negative cardio ROS ?Normal cardiovascular exam ?Rhythm:regular Rate:Normal ? ? ?  ?Neuro/Psych ? Headaches, negative psych ROS  ? GI/Hepatic ?Neg liver ROS, GERD  Medicated,  ?Endo/Other  ?negative endocrine ROS ? Renal/GU ?negative Renal ROS  ?negative genitourinary ?  ?Musculoskeletal ? ? Abdominal ?  ?Peds ? Hematology ?negative hematology ROS ?(+)   ?Anesthesia Other Findings ?Past Medical History: ?No date: Anxiety ?No date: Colitis ?No date: Diverticulitis large intestine ?10/12/2013: Diverticulitis of colon ?No date: Frequent headaches ?No date: Gout ?No date: Hypertension ?Past Surgical History: ?11/20/2017: COLONOSCOPY WITH PROPOFOL; N/A ?    Comment:  Procedure: COLONOSCOPY WITH PROPOFOL;  Surgeon: Tobi Bastos,  ?             Sharlet Salina, MD;  Location: [facility] ENDOSCOPY;  Service:  ?             Gastroenterology;  Laterality: N/A; ?No date: EYE SURGERY; Left ?    Comment:  age 17 ?BMI   ? Body Mass Index: 29.69 kg/m?  ?  ? Reproductive/Obstetrics ?negative OB ROS ? ?  ? ? ? ? ? ? ? ? ? ? ? ? ? ?  ?  ? ? ? ? ? ? ? ? ?Anesthesia Physical ?Anesthesia Plan ? ?ASA: 2 ? ?Anesthesia Plan: General  ? ?Post-op Pain Management:   ? ?Induction:  ? ?PONV Risk Score and Plan:  ? ?Airway Management Planned:  ? ?Additional Equipment:  ? ?Intra-op Plan:  ? ?Post-operative Plan:  ? ?Informed Consent: I have reviewed the patients History and Physical, chart, labs and discussed the procedure including the risks, benefits and alternatives  for the proposed anesthesia with the patient or authorized representative who has indicated his/her understanding and acceptance.  ? ? ? ?Dental Advisory Given ? ?Plan Discussed with: CRNA ? ?Anesthesia Plan Comments:   ? ? ? ? ? ? ?Anesthesia Quick Evaluation ? ?

## 2022-04-16 NOTE — Anesthesia Procedure Notes (Signed)
Procedure Name: General with mask airway ?Date/Time: 04/16/2022 1:47 PM ?Performed by: Mohammed Kindle, CRNA ?Pre-anesthesia Checklist: Patient identified, Emergency Drugs available, Suction available and Patient being monitored ?Patient Re-evaluated:Patient Re-evaluated prior to induction ?Oxygen Delivery Method: Simple face mask ?Induction Type: IV induction ?Placement Confirmation: positive ETCO2 and CO2 detector ?Dental Injury: Teeth and Oropharynx as per pre-operative assessment  ? ? ? ? ?

## 2022-04-16 NOTE — Op Note (Signed)
[facility] ?Gastroenterology ?Patient Name: Darrell Lawson ?Procedure Date: 04/16/2022 1:37 PM ?MRN: [id number] ?Account #: [id number] ?Date of Birth: 25-May-1971 ?Admit Type: Outpatient ?Age: 51 ?Room: [facility] ENDO ROOM 3 ?Gender: Male ?Note Status: Finalized ?Instrument Name: Colonscope [id number] ?Procedure:             Colonoscopy ?Indications:           Rectal bleeding ?Providers:             Wyline Mood MD, MD ?Referring MD:          Doreene Nest (Referring MD) ?Medicines:             Monitored Anesthesia Care ?Complications:         No immediate complications. ?Procedure:             Pre-Anesthesia Assessment: ?                       - Prior to the procedure, a History and Physical was  ?                       performed, and patient medications, allergies and  ?                       sensitivities were reviewed. The patient's tolerance  ?                       of previous anesthesia was reviewed. ?                       - The risks and benefits of the procedure and the  ?                       sedation options and risks were discussed with the  ?                       patient. All questions were answered and informed  ?                       consent was obtained. ?                       - ASA Grade Assessment: II - A patient with mild  ?                       systemic disease. ?                       After obtaining informed consent, the colonoscope was  ?                       passed under direct vision. Throughout the procedure,  ?                       the patient's blood pressure, pulse, and oxygen  ?                       saturations were monitored continuously. The  ?                       Colonoscope was introduced through the anus  and  ?                       advanced to the the cecum, identified by the  ?                       appendiceal orifice. The quality of the bowel  ?                       preparation was excellent. ?Findings: ?     The perianal and digital rectal examinations  were normal. ?     Multiple small and large-mouthed diverticula were found in the sigmoid  ?     colon. ?     Three sessile polyps were found in the cecum. The polyps were 4 to 6 mm  ?     in size. These polyps were removed with a cold snare. Resection and  ?     retrieval were complete. ?     Two sessile polyps were found in the descending colon. The polyps were 3  ?     to 4 mm in size. These polyps were removed with a jumbo cold forceps.  ?     Resection and retrieval were complete. ?     Non-bleeding internal hemorrhoids were found during retroflexion. The  ?     hemorrhoids were medium-sized and Grade I (internal hemorrhoids that do  ?     not prolapse). ?     The exam was otherwise without abnormality on direct and retroflexion  ?     views. ?Impression:            - Diverticulosis in the sigmoid colon. ?                       - Three 4 to 6 mm polyps in the cecum, removed with a  ?                       cold snare. Resected and retrieved. ?                       - Two 3 to 4 mm polyps in the descending colon,  ?                       removed with a jumbo cold forceps. Resected and  ?                       retrieved. ?                       - Non-bleeding internal hemorrhoids. ?                       - The examination was otherwise normal on direct and  ?                       retroflexion views. ?Recommendation:        - Discharge patient to home (with escort). ?                       - Resume previous diet. ?                       -  Continue present medications. ?                       - Await pathology results. ?                       - Repeat colonoscopy for surveillance based on  ?                       pathology results. ?Procedure Code(s):     --- Professional --- ?                       [phone], Colonoscopy, flexible; with removal of  ?                       tumor(s), polyp(s), or other lesion(s) by snare  ?                       technique ?                       45380, 59, Colonoscopy, flexible; with  biopsy, single  ?                       or multiple ?Diagnosis Code(s):     --- Professional --- ?                       K64.0, First degree hemorrhoids ?                       K63.5, Polyp of colon ?                       K62.5, Hemorrhage of anus and rectum ?                       K57.30, Diverticulosis of large intestine without  ?                       perforation or abscess without bleeding ?CPT copyright 2019 [employer]. All rights reserved. ?The codes documented in this report are preliminary and upon coder review may  ?be revised to meet current compliance requirements. ?Wyline Mood, MD ?Wyline Mood MD, MD ?04/16/2022 2:02:03 PM ?This report has been signed electronically. ?Number of Addenda: 0 ?Note Initiated On: 04/16/2022 1:37 PM ?Scope Withdrawal Time: 0 hours 13 minutes 35 seconds  ?Total Procedure Duration: 0 hours 16 minutes 12 seconds  ?Estimated Blood Loss:  Estimated blood loss: none. ?     [facility] ?

## 2022-04-16 NOTE — Transfer of Care (Signed)
Immediate Anesthesia Transfer of Care Note ? ?Patient: Darrell Lawson ? ?Procedure(s) Performed: COLONOSCOPY WITH PROPOFOL ? ?Patient Location: Endoscopy Unit ? ?Anesthesia Type:General ? ?Level of Consciousness: awake, drowsy and patient cooperative ? ?Airway & Oxygen Therapy: Patient Spontanous Breathing and Patient connected to face mask oxygen ? ?Post-op Assessment: Report given to RN and Post -op Vital signs reviewed and stable ? ?Post vital signs: Reviewed and stable ? ?Last Vitals:  ?Vitals Value Taken Time  ?BP    ?Temp    ?Pulse    ?Resp    ?SpO2    ? ? ?Last Pain:  ?Vitals:  ? 04/16/22 1239  ?TempSrc: Temporal  ?   ? ?  ? ?Complications: No notable events documented. ?

## 2022-04-16 NOTE — H&P (Signed)
? ? ? ?Wyline Mood, MD ?[address], [place], [place], [address] ?[address], [address], [place], [place], [address] ?Phone: [phone]  ?Fax: [phone] ? ?Primary Care Physician:  Doreene Nest, NP ? ? ?Pre-Procedure History & Physical: ?HPI:  Darrell Lawson is a 51 y.o. male is here for an colonoscopy. ?  ?Past Medical History:  ?Diagnosis Date  ? Anxiety   ? Colitis   ? Diverticulitis large intestine   ? Diverticulitis of colon 10/12/2013  ? Frequent headaches   ? Gout   ? Hypertension   ? ? ?Past Surgical History:  ?Procedure Laterality Date  ? COLONOSCOPY WITH PROPOFOL N/A 11/20/2017  ? Procedure: COLONOSCOPY WITH PROPOFOL;  Surgeon: Wyline Mood, MD;  Location: [facility] ENDOSCOPY;  Service: Gastroenterology;  Laterality: N/A;  ? EYE SURGERY Left   ? age 27  ? ? ?Prior to Admission medications   ?Medication Sig Start Date End Date Taking? Authorizing Provider  ?amLODipine (NORVASC) 5 MG tablet TAKE 1 TABLET BY MOUTH EVERY DAY FOR BLOOD PRESSURE 12/23/21  Yes Doreene Nest, NP  ?ciprofloxacin (CIPRO) 500 MG tablet Take 1 tablet (500 mg total) by mouth 2 (two) times daily for 7 days. 04/09/22 04/16/22 Yes Doreene Nest, NP  ?metroNIDAZOLE (FLAGYL) 500 MG tablet Take 1 tablet (500 mg total) by mouth 3 (three) times daily for 7 days. 04/09/22 04/16/22 Yes Doreene Nest, NP  ?sertraline (ZOLOFT) 50 MG tablet TAKE 1 TABLET (50 MG TOTAL) BY MOUTH DAILY. FOR ANXIETY. 03/29/21   Doreene Nest, NP  ?tamsulosin (FLOMAX) 0.4 MG CAPS capsule Take 1 capsule (0.4 mg total) by mouth daily. For urinary frequency ?Patient not taking: Reported on 04/09/2022 10/22/21   Doreene Nest, NP  ?Zinc 50 MG CAPS Take 1 capsule by mouth daily. ?Patient not taking: Reported on 04/09/2022    [provider]  ? ? ?Allergies as of 04/15/2022 - Review Complete 04/15/2022  ?Allergen Reaction Noted  ? Morphine and related  09/11/2017  ? ? ?Family History  ?Problem Relation Age of Onset  ? Hypertension  Mother   ? Hypertension Father   ? Heart disease Father   ? Diabetes Father   ? Cancer Maternal Grandmother   ?     breast  ? ? ?Social History  ? ?Socioeconomic History  ? Marital status: Married  ?  Spouse name: Not on file  ? Number of children: Not on file  ? Years of education: Not on file  ? Highest education level: Not on file  ?Occupational History  ?  Employer: [employer]  ?Tobacco Use  ? Smoking status: Never  ? Smokeless tobacco: Never  ?Vaping Use  ? Vaping Use: Never used  ?Substance and Sexual Activity  ? Alcohol use: No  ? Drug use: No  ? Sexual activity: Yes  ?Other Topics Concern  ? Not on file  ?Social History Narrative  ? Not on file  ? ?Social Determinants of Health  ? ?Financial Resource Strain: Not on file  ?Food Insecurity: Not on file  ?Transportation Needs: Not on file  ?Physical Activity: Not on file  ?Stress: Not on file  ?Social Connections: Not on file  ?Intimate Partner Violence: Not on file  ? ? ?Review of Systems: ?See HPI, otherwise negative ROS ? ?Physical Exam: ?BP (!) 135/91   Pulse 72   Temp (!) 96.7 ?F (35.9 ?C) (Temporal)   Resp 18   Ht 6\' 1"  (1.854 m)  Wt 102.1 kg   SpO2 100%   BMI 29.69 kg/m?  ?General:   Alert,  pleasant and cooperative in NAD ?Head:  Normocephalic and atraumatic. ?Neck:  Supple; no masses or thyromegaly. ?Lungs:  Clear throughout to auscultation, normal respiratory effort.    ?Heart:  +S1, +S2, Regular rate and rhythm, No edema. ?Abdomen:  Soft, nontender and nondistended. Normal bowel sounds, without guarding, and without rebound.   ?Neurologic:  Alert and  oriented x4;  grossly normal neurologically. ? ?Impression/Plan: ?Darrell Lawson is here for an colonoscopy to be performed for rectal bleeding ?Risks, benefits, limitations, and alternatives regarding  colonoscopy have been reviewed with the patient.  Questions have been answered.  All parties agreeable. ? ? ?Wyline Mood, MD  04/16/2022, 1:34 PM ? ?

## 2022-04-17 NOTE — Anesthesia Postprocedure Evaluation (Signed)
Anesthesia Post Note  Patient: AVONDRE RICHENS  Procedure(s) Performed: COLONOSCOPY WITH PROPOFOL  Patient location during evaluation: PACU Anesthesia Type: General Level of consciousness: awake and alert Pain management: pain level controlled Vital Signs Assessment: post-procedure vital signs reviewed and stable Respiratory status: spontaneous breathing, nonlabored ventilation, respiratory function stable and patient connected to nasal cannula oxygen Cardiovascular status: blood pressure returned to baseline and stable Postop Assessment: no apparent nausea or vomiting Anesthetic complications: no   No notable events documented.   Last Vitals:  Vitals:   04/16/22 1410 04/16/22 1420  BP: 114/72 110/64  Pulse: 68 67  Resp: 16 20  Temp:    SpO2: 100% 100%    Last Pain:  Vitals:   04/16/22 1400  TempSrc: Temporal                 Yevette Edwards

## 2022-04-18 ENCOUNTER — Encounter: Payer: Self-pay | Admitting: Gastroenterology

## 2022-04-18 LAB — SURGICAL PATHOLOGY

## 2022-04-21 ENCOUNTER — Encounter: Payer: Self-pay | Admitting: Gastroenterology

## 2022-04-21 NOTE — Progress Notes (Signed)
Darrell Lawson

## 2022-06-18 ENCOUNTER — Other Ambulatory Visit: Payer: Self-pay | Admitting: Primary Care

## 2022-06-18 DIAGNOSIS — F411 Generalized anxiety disorder: Secondary | ICD-10-CM

## 2022-08-12 MED ORDER — METRONIDAZOLE 500 MG PO TABS: 500.0000 mg | ORAL_TABLET | Freq: Three times a day (TID) | ORAL | 0 refills | Status: AC

## 2022-08-12 MED ORDER — CIPROFLOXACIN HCL 500 MG PO TABS: 500.0000 mg | ORAL_TABLET | Freq: Two times a day (BID) | ORAL | 0 refills | Status: AC

## 2022-08-12 NOTE — Telephone Encounter (Signed)
Spoke to patient by telephone and was advised that he has a flare-up with diverticulitis about every 5 months or so. Patient stated that Mayra Reel NP usually treats him with two medications which clears it up. Patient stated that he started with symptoms last night hoping that they would go away but they have not. Patient complains of left side abdominal pain beside his belly button and nausea. Patient denies a fever, diarrhea, or any rectal bleeding. Patient stated that his pain level at times is a #6. Patient stated that he is hoping to get medication so his symptoms will not get any worse. Pharmacy [facility]

## 2022-08-12 NOTE — Telephone Encounter (Signed)
Plz notify I've sent in cipro and flagyl antibiotic for him to take.  If not already done, I recommend he first try bowel rest with clear liquid diet for 1-2 days - as this can often take care of diverticulitis inflammation without needing antibiotic treatment.  However, if no improvement noted with clear liquid diet, or any fever, worsening abd pain, go ahead and take antibiotics.  Would recommend OV for recheck later this week.  Ensure no dysuria or UTI symptoms.   Fyi to Mayra Reel

## 2022-08-12 NOTE — Telephone Encounter (Signed)
Patient misspoke: I do not prescribe antibiotics without an office visit, ever.   I do appreciate Dr. Nicanor Alcon assistance I managing this patient during my absence.

## 2022-08-12 NOTE — Telephone Encounter (Signed)
Patient notified as instructed by telephone and verbalized understanding. Patient stated that he has not had any urinary issues. Patient was advised if he needs to take the antibiotics Dr. Sharen Hones feels that he needs an office visit in the next couple of days. Patient scheduled an appointment with Dr. Para March 08/15/22 at 3:00 pm. Patient stated that he will call back and cancel the appointment if he gets better and does not have to take the antibiotics.

## 2022-08-15 ENCOUNTER — Ambulatory Visit: Payer: 59 | Admitting: Family Medicine

## 2022-08-20 ENCOUNTER — Ambulatory Visit: Payer: 59 | Admitting: Primary Care

## 2022-08-29 ENCOUNTER — Ambulatory Visit: Payer: 59 | Admitting: Primary Care

## 2022-10-28 ENCOUNTER — Encounter: Payer: 59 | Admitting: Primary Care
